# Patient Record
Sex: Female | Born: 1995 | Race: White | Hispanic: No | Smoking: Former smoker
Health system: Southern US, Community
[De-identification: ages and names within clinical notes are randomized; demographics above are authoritative.]

## PROBLEM LIST (undated history)

## (undated) ENCOUNTER — Inpatient Hospital Stay (HOSPITAL_COMMUNITY): Payer: Self-pay

## (undated) DIAGNOSIS — O2303 Infections of kidney in pregnancy, third trimester: Secondary | ICD-10-CM

## (undated) DIAGNOSIS — I10 Essential (primary) hypertension: Secondary | ICD-10-CM

## (undated) DIAGNOSIS — O139 Gestational [pregnancy-induced] hypertension without significant proteinuria, unspecified trimester: Secondary | ICD-10-CM

## (undated) DIAGNOSIS — D649 Anemia, unspecified: Secondary | ICD-10-CM

## (undated) DIAGNOSIS — N189 Chronic kidney disease, unspecified: Secondary | ICD-10-CM

## (undated) DIAGNOSIS — N390 Urinary tract infection, site not specified: Secondary | ICD-10-CM

## (undated) HISTORY — DX: Chronic kidney disease, unspecified: N18.9

## (undated) HISTORY — PX: NO PAST SURGERIES: SHX2092

## (undated) HISTORY — DX: Essential (primary) hypertension: I10

## (undated) HISTORY — DX: Anemia, unspecified: D64.9

## (undated) HISTORY — DX: Gestational (pregnancy-induced) hypertension without significant proteinuria, unspecified trimester: O13.9

---
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
## 2017-09-26 ENCOUNTER — Encounter (HOSPITAL_COMMUNITY): Payer: Self-pay | Admitting: Emergency Medicine

## 2017-09-26 ENCOUNTER — Other Ambulatory Visit: Payer: Self-pay

## 2017-09-26 ENCOUNTER — Emergency Department (HOSPITAL_COMMUNITY): Payer: Self-pay

## 2017-09-26 ENCOUNTER — Emergency Department (HOSPITAL_COMMUNITY)
Admission: EM | Admit: 2017-09-26 | Discharge: 2017-09-26 | Disposition: A | Discharge status: Home / Self Care | Payer: Self-pay | Attending: Emergency Medicine | Admitting: Emergency Medicine

## 2017-09-26 DIAGNOSIS — Z87891 Personal history of nicotine dependence: Secondary | ICD-10-CM | POA: Insufficient documentation

## 2017-09-26 DIAGNOSIS — Z79899 Other long term (current) drug therapy: Secondary | ICD-10-CM | POA: Insufficient documentation

## 2017-09-26 DIAGNOSIS — M25561 Pain in right knee: Secondary | ICD-10-CM | POA: Insufficient documentation

## 2017-09-26 DIAGNOSIS — Y939 Activity, unspecified: Secondary | ICD-10-CM | POA: Insufficient documentation

## 2017-09-26 DIAGNOSIS — Y998 Other external cause status: Secondary | ICD-10-CM | POA: Insufficient documentation

## 2017-09-26 DIAGNOSIS — R52 Pain, unspecified: Secondary | ICD-10-CM

## 2017-09-26 DIAGNOSIS — S8011XA Contusion of right lower leg, initial encounter: Secondary | ICD-10-CM | POA: Insufficient documentation

## 2017-09-26 DIAGNOSIS — R51 Headache: Secondary | ICD-10-CM | POA: Insufficient documentation

## 2017-09-26 DIAGNOSIS — S0001XA Abrasion of scalp, initial encounter: Secondary | ICD-10-CM

## 2017-09-26 DIAGNOSIS — M25552 Pain in left hip: Secondary | ICD-10-CM | POA: Insufficient documentation

## 2017-09-26 DIAGNOSIS — S0181XA Laceration without foreign body of other part of head, initial encounter: Secondary | ICD-10-CM | POA: Insufficient documentation

## 2017-09-26 DIAGNOSIS — Y92410 Unspecified street and highway as the place of occurrence of the external cause: Secondary | ICD-10-CM | POA: Insufficient documentation

## 2017-09-26 LAB — CBC WITH DIFFERENTIAL/PLATELET
Abs Immature Granulocytes: 0.1 10*3/uL (ref 0.0–0.1)
Basophils Absolute: 0.1 10*3/uL (ref 0.0–0.1)
Basophils Relative: 0 %
EOS ABS: 0 10*3/uL (ref 0.0–0.7)
Eosinophils Relative: 0 %
HEMATOCRIT: 38.8 % (ref 36.0–46.0)
Hemoglobin: 12.3 g/dL (ref 12.0–15.0)
IMMATURE GRANULOCYTES: 1 %
LYMPHS ABS: 1.5 10*3/uL (ref 0.7–4.0)
LYMPHS PCT: 7 %
MCH: 25.5 pg — ABNORMAL LOW (ref 26.0–34.0)
MCHC: 31.7 g/dL (ref 30.0–36.0)
MCV: 80.5 fL (ref 78.0–100.0)
Monocytes Absolute: 1.8 10*3/uL — ABNORMAL HIGH (ref 0.1–1.0)
Monocytes Relative: 9 %
NEUTROS PCT: 83 %
Neutro Abs: 16.7 10*3/uL — ABNORMAL HIGH (ref 1.7–7.7)
PLATELETS: 351 10*3/uL (ref 150–400)
RBC: 4.82 MIL/uL (ref 3.87–5.11)
RDW: 14.6 % (ref 11.5–15.5)
WBC: 20.2 10*3/uL — AB (ref 4.0–10.5)

## 2017-09-26 LAB — I-STAT BETA HCG BLOOD, ED (MC, WL, AP ONLY): I-stat hCG, quantitative: <5 m[IU]/mL (ref <5)

## 2017-09-26 LAB — BASIC METABOLIC PANEL
Anion gap: 9 (ref 5–15)
BUN: 13 mg/dL (ref 6–20)
CHLORIDE: 103 mmol/L (ref 101–111)
CO2: 24 mmol/L (ref 22–32)
CREATININE: 0.81 mg/dL (ref 0.44–1.00)
Calcium: 8.9 mg/dL (ref 8.9–10.3)
GFR calc non Af Amer: >60 mL/min (ref >60)
Glucose, Bld: 107 mg/dL — ABNORMAL HIGH (ref 65–99)
POTASSIUM: 3.9 mmol/L (ref 3.5–5.1)
SODIUM: 136 mmol/L (ref 135–145)

## 2017-09-26 LAB — PROTIME-INR
INR: 1.03
Prothrombin Time: 13.4 seconds (ref 11.4–15.2)

## 2017-09-26 MED ORDER — IBUPROFEN 600 MG PO TABS: 600.0000 mg | ORAL_TABLET | Freq: Four times a day (QID) | ORAL | 0 refills | Status: DC | PRN

## 2017-09-26 MED ORDER — FENTANYL CITRATE (PF) 100 MCG/2ML IJ SOLN
50.0000 ug | Freq: Once | INTRAMUSCULAR | Status: AC
Start: 2017-09-26 — End: 2017-09-26
  Administered 2017-09-26: 50 ug via INTRAVENOUS
  Filled 2017-09-26: qty 2

## 2017-09-26 MED ORDER — KETOROLAC TROMETHAMINE 15 MG/ML IJ SOLN
15.0000 mg | Freq: Once | INTRAMUSCULAR | Status: AC
  Administered 2017-09-26: 15 mg via INTRAVENOUS
  Filled 2017-09-26: qty 1

## 2017-09-26 MED ORDER — LIDOCAINE HCL (PF) 1 % IJ SOLN: 10.0000 mL | Freq: Once | INTRAMUSCULAR | Status: DC

## 2017-09-26 MED ORDER — FENTANYL CITRATE (PF) 100 MCG/2ML IJ SOLN
50.0000 ug | Freq: Once | INTRAMUSCULAR | Status: AC
  Administered 2017-09-26: 50 ug via INTRAVENOUS
  Filled 2017-09-26: qty 2

## 2017-09-26 MED ORDER — LIDOCAINE HCL (PF) 1 % IJ SOLN
INTRAMUSCULAR | Status: AC
  Filled 2017-09-26: qty 30

## 2017-09-26 MED ORDER — LIDOCAINE HCL (PF) 1 % IJ SOLN
10.0000 mL | Freq: Once | INTRAMUSCULAR | Status: DC
  Filled 2017-09-26: qty 10

## 2017-09-26 MED ORDER — SODIUM CHLORIDE 0.9 % IV BOLUS
1000.0000 mL | Freq: Once | INTRAVENOUS | Status: AC
  Administered 2017-09-26: 1000 mL via INTRAVENOUS

## 2017-09-26 NOTE — ED Notes (Signed)
Pt used female urinal. output
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
## 2017-10-06 ENCOUNTER — Other Ambulatory Visit: Payer: Self-pay

## 2017-10-06 ENCOUNTER — Emergency Department (HOSPITAL_COMMUNITY)
Admission: EM | Admit: 2017-10-06 | Discharge: 2017-10-06 | Disposition: A | Discharge status: Home / Self Care | Payer: Self-pay | Attending: Emergency Medicine | Admitting: Emergency Medicine

## 2017-10-06 ENCOUNTER — Encounter (HOSPITAL_COMMUNITY): Payer: Self-pay | Admitting: Emergency Medicine

## 2017-10-06 DIAGNOSIS — Z4802 Encounter for removal of sutures: Secondary | ICD-10-CM | POA: Insufficient documentation
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
## 2017-10-06 NOTE — ED Triage Notes (Signed)
Pt. Stated, Im here to get staples/sutures removed. Placed June 22. .Marland Kitchen

## 2017-10-06 NOTE — ED Notes (Signed)
Staple and suture removal kits at bedside.

## 2017-10-14 ENCOUNTER — Encounter (INDEPENDENT_AMBULATORY_CARE_PROVIDER_SITE_OTHER): Payer: Self-pay | Admitting: Orthopaedic Surgery

## 2017-10-14 ENCOUNTER — Ambulatory Visit (INDEPENDENT_AMBULATORY_CARE_PROVIDER_SITE_OTHER): Payer: Self-pay | Admitting: Orthopaedic Surgery

## 2017-10-14 DIAGNOSIS — S8011XA Contusion of right lower leg, initial encounter: Secondary | ICD-10-CM
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
## 2018-04-07 NOTE — L&D Delivery Note (Signed)
Delivery Note  Gabriella Peters 23 y.o. [id number] at [redacted]w[redacted]d admitted to L&D 9/22 for IOL 2/2 GHTN. On admit she was 3/50/ballotable, started pitocin at 1:30pm, progressed to 4/50/-2 and was AROM'ed at 6pm. She continued to progress well, At 9pm 6.5/90/-1 and completed at 10:18pm. Pushing then ensued, with excellent maternal effort, at 10:36 PM a viable female was delivered via Vaginal, Spontaneous.  Presentation: vertex; Position: Left,OA; Station: +5. Following delivery of the head, which restituted maternal right. The body then did not deliver spontaneously, shoulder deliver called, mcroberts and then suprapubic pressure were performed followed by posterior arm and the left arm was delivered with 45 seconds. Cord was clamped and cut and handed off to the nurses. The placenta then delivered spontaneously and intact. Uterus firm. 1st degree hemostatic without repair and bilateral periurethral tears hemostatic without repair. EBL 100cc. Mom and husband updated regarding shoulder dystocia, all questions answered. Baby moving all extremitities. Baby skin to skin with mom.   Delivery of the head: 12/28/2018 10:36 PM First maneuver: 12/28/2018 10:36 PM, McRoberts Second maneuver: 12/28/2018 10:36 PM, Suprapubic Pressure Third maneuver: 12/28/2018 10:36 PM,  Left posterior arm delivered  Verbal consent: obtained from patient.  APGAR: 6, 9; weight 3330g .   Placenta status: intact Cord:  Venous Cord pH: 7.325  Anesthesia:   Episiotomy: None Lacerations: 1st degree;Periurethral Suture Repair: hemostatic without repair Est. Blood Loss (mL): 100  Mom to postpartum.  Baby to Couplet care / Skin to Skin.  Gabriella Peters 12/28/2018, 10:49 PM

## 2018-05-25 LAB — OB RESULTS CONSOLE RUBELLA ANTIBODY, IGM: Rubella: IMMUNE

## 2018-05-25 LAB — OB RESULTS CONSOLE GC/CHLAMYDIA
Chlamydia: NEGATIVE
Gonorrhea: NEGATIVE

## 2018-05-25 LAB — OB RESULTS CONSOLE HGB/HCT, BLOOD
HCT: 42 — AB (ref 29–41)
Hemoglobin: 12.9

## 2018-05-25 LAB — OB RESULTS CONSOLE RPR: RPR: NONREACTIVE

## 2018-05-25 LAB — OB RESULTS CONSOLE HIV ANTIBODY (ROUTINE TESTING): HIV: NONREACTIVE

## 2018-05-25 LAB — OB RESULTS CONSOLE PLATELET COUNT: Platelets: 332

## 2018-05-25 LAB — OB RESULTS CONSOLE HEPATITIS B SURFACE ANTIGEN: Hepatitis B Surface Ag: NEGATIVE

## 2018-05-25 LAB — CYSTIC FIBROSIS DIAGNOSTIC STUDY: Interpretation-CFDNA:: NEGATIVE

## 2018-08-29 IMAGING — US US ABDOMEN LIMITED
1 series · 14 of 25 positions shown · non-contrast
Comparison: None.

CLINICAL DATA: Right upper quadrant pain for 1 week

EXAM:
US ABDOMEN LIMITED - RIGHT UPPER QUADRANT

[Series 1: us abdomen limited · 0.20mm/px · 14 of 51 slices shown]
[im 1/51]
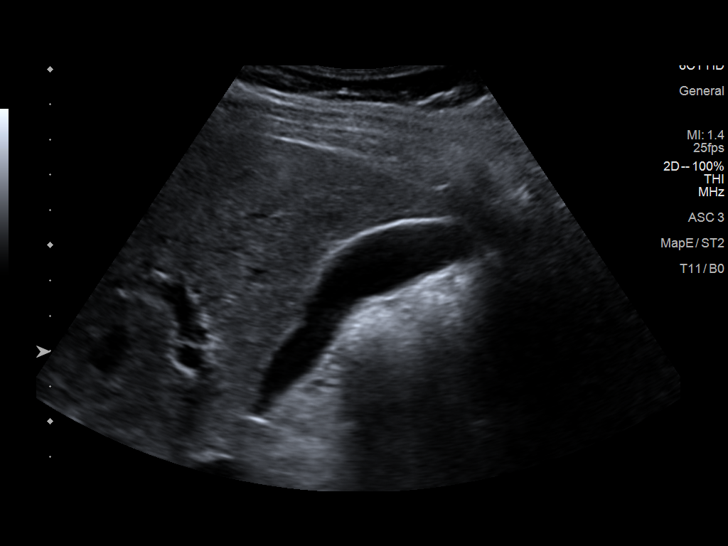
[im 5/51]
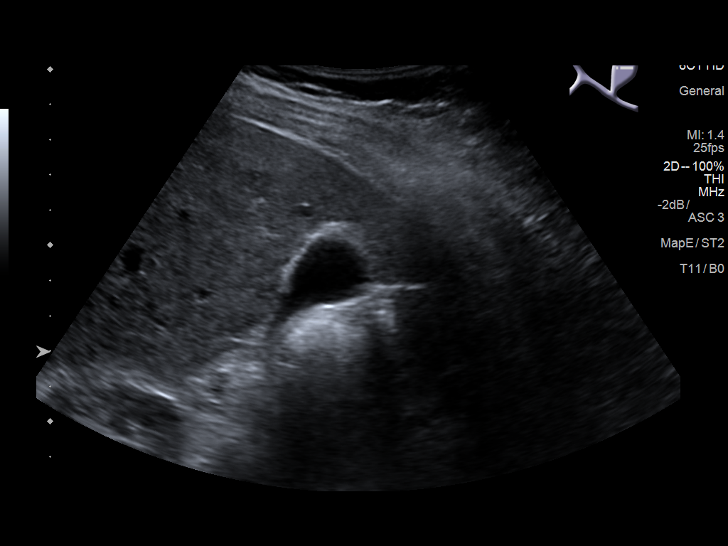
[im 9/51]
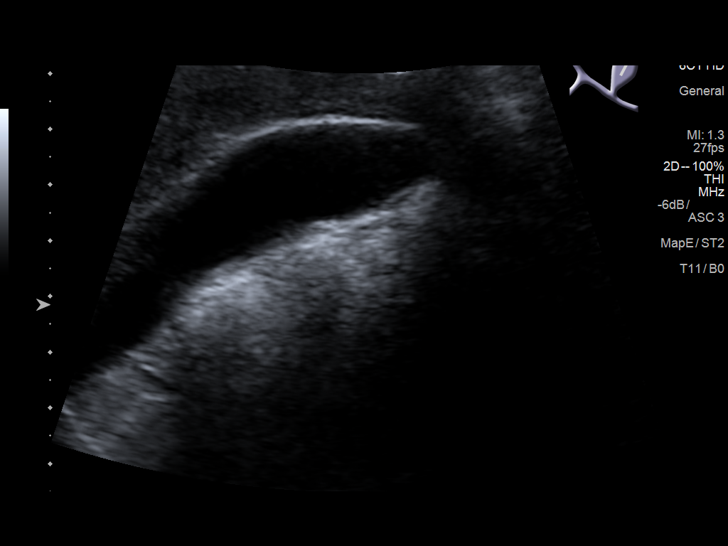
[im 13/51]
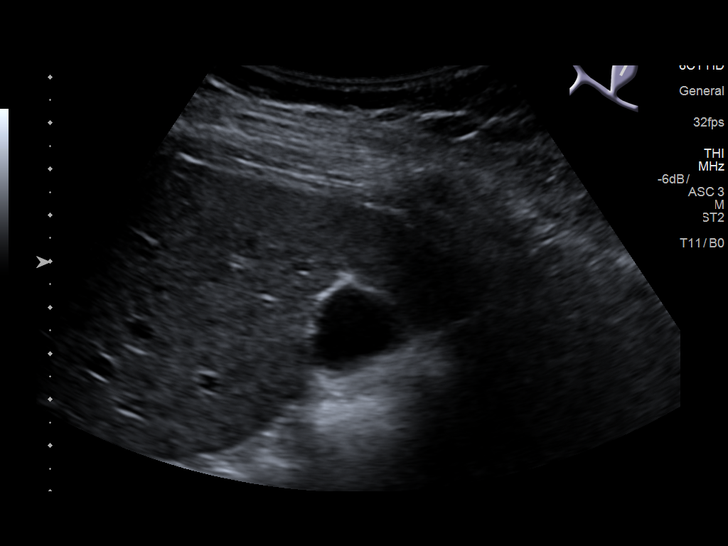
[im 17/51]
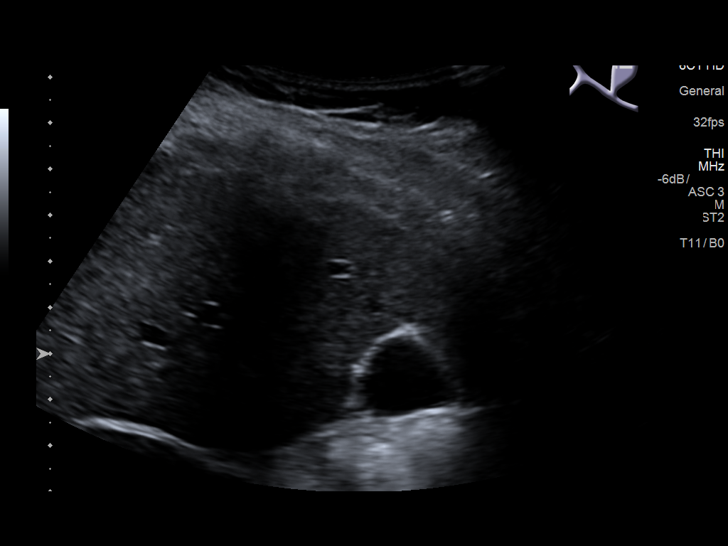
[im 19/51]
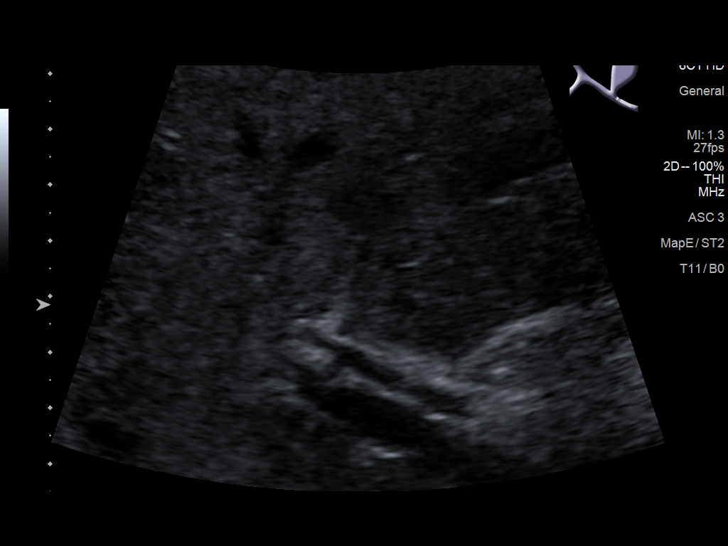
[im 23/51]
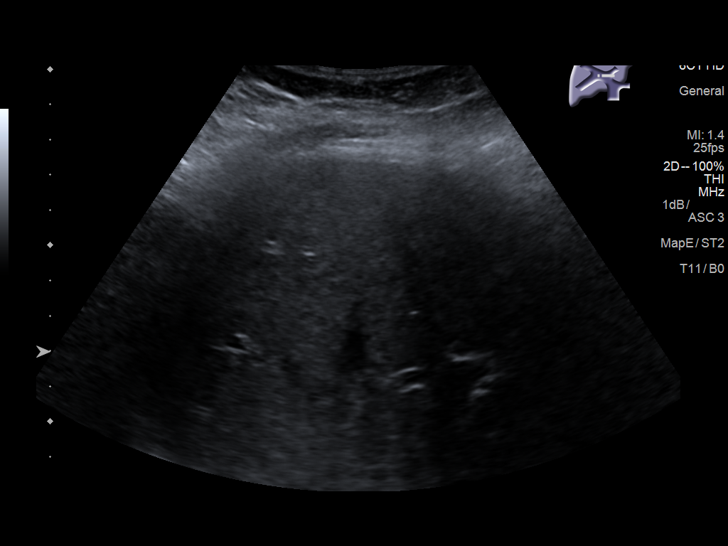
[im 28/51]
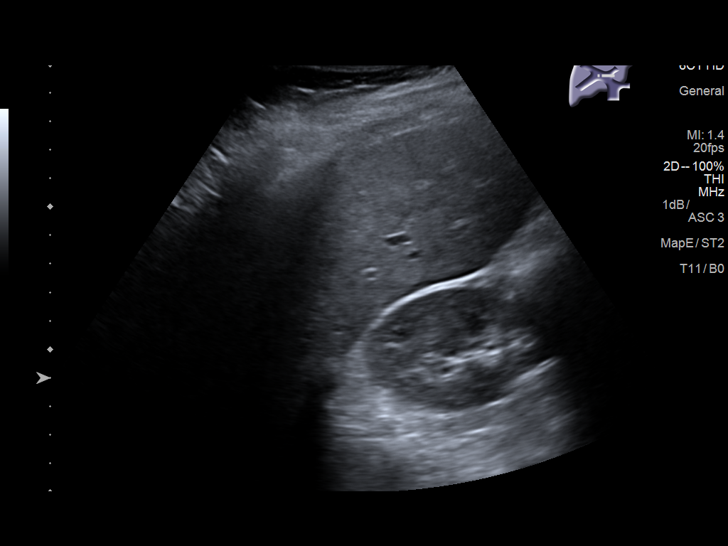
[im 32/51]
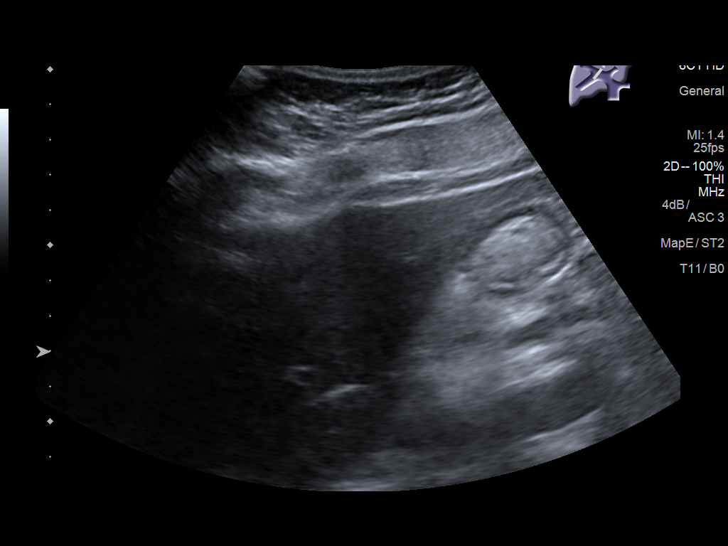
[im 34/51]
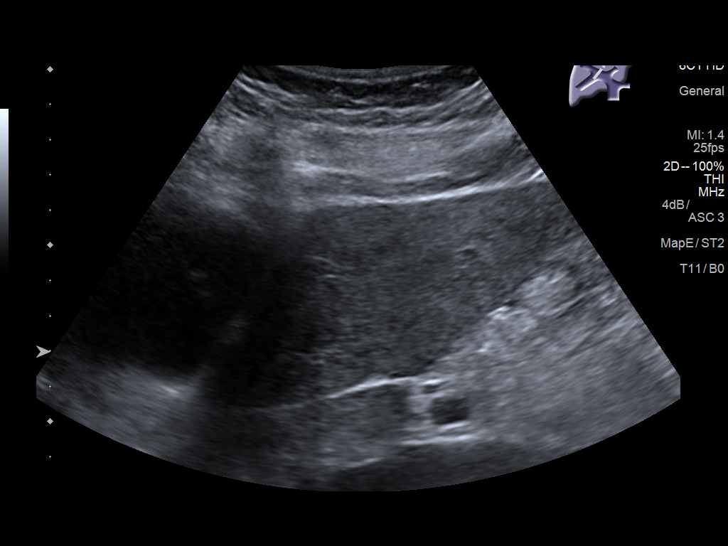
[im 38/51]
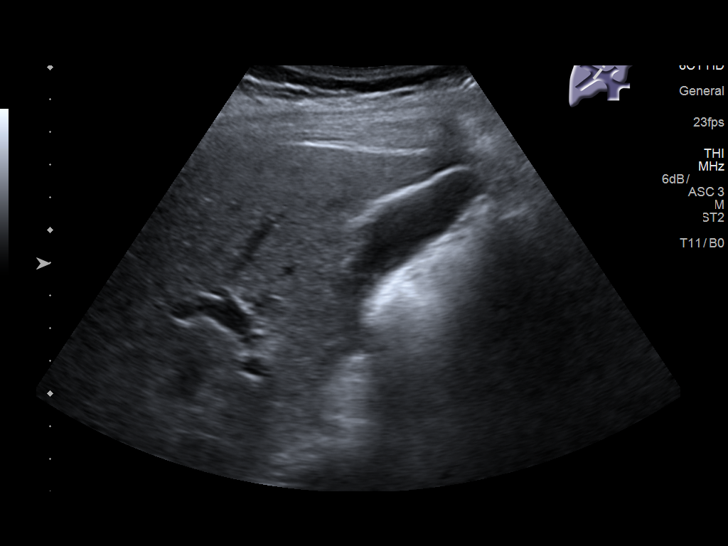
[im 42/51]
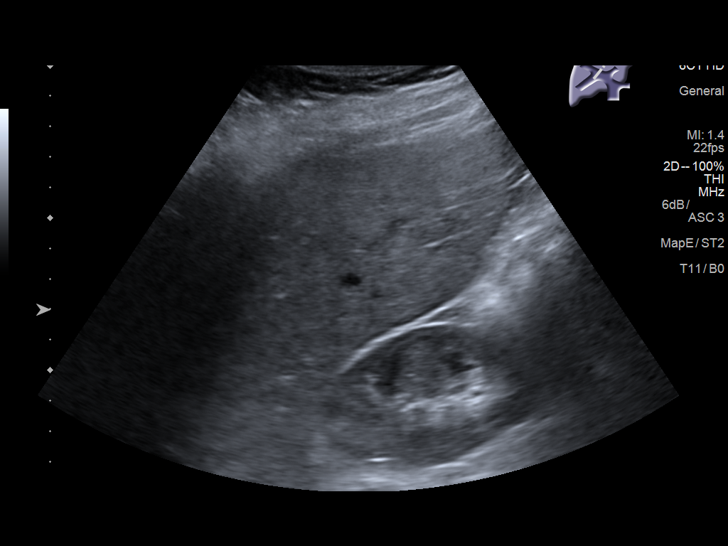
[im 46/51]
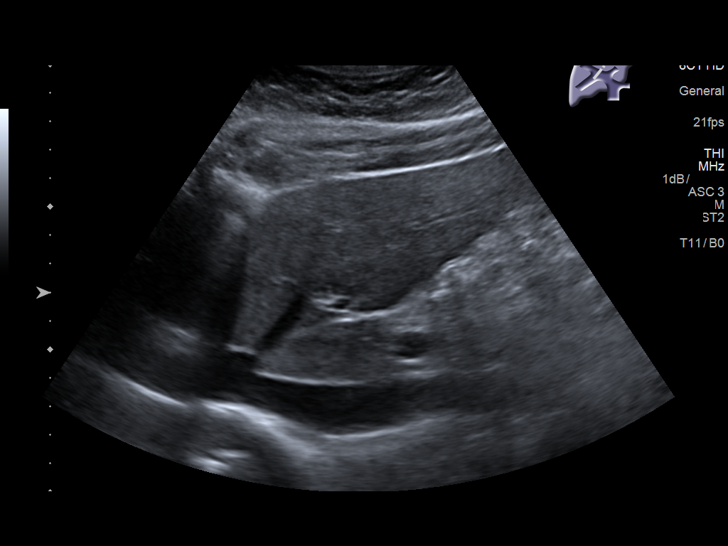
[im 51/51]
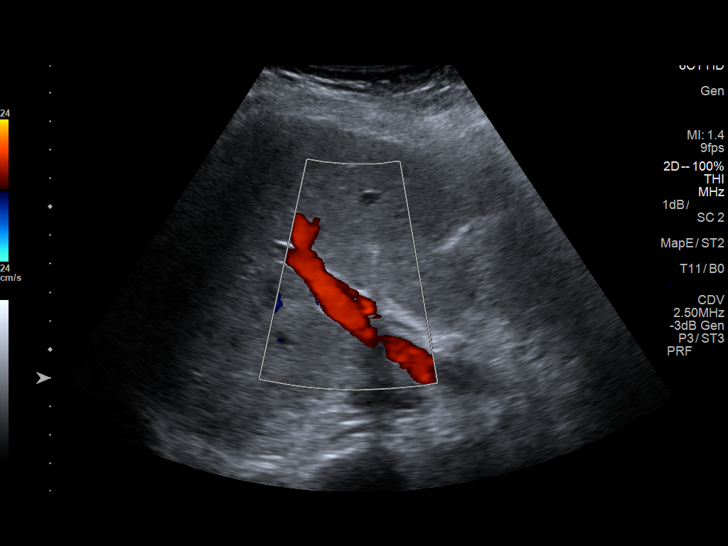

[14 of 25 positions shown; findings below may reference images not displayed]

FINDINGS: Gallbladder:

No gallstones or wall thickening visualized. No sonographic Murphy
sign noted by sonographer.

Common bile duct:

Diameter: Normal at 4 mm

Liver:

No focal lesion identified. Within normal limits in parenchymal
echogenicity.
IMPRESSION: Negative right upper quadrant abdominal ultrasound

## 2018-11-16 LAB — OB RESULTS CONSOLE RPR: RPR: NONREACTIVE

## 2018-11-16 LAB — HIV ANTIBODY (ROUTINE TESTING W REFLEX): HIV Screen 4th Generation wRfx: NONREACTIVE

## 2018-11-16 LAB — OB RESULTS CONSOLE HEPATITIS B SURFACE ANTIGEN: Hepatitis B Surface Ag: NEGATIVE

## 2018-11-27 ENCOUNTER — Inpatient Hospital Stay (HOSPITAL_COMMUNITY)
Admission: AD | Admit: 2018-11-27 | Discharge: 2018-11-28 | Disposition: A | Discharge status: Home / Self Care | Payer: BLUE CROSS/BLUE SHIELD | Attending: Obstetrics and Gynecology | Admitting: Obstetrics and Gynecology

## 2018-11-27 ENCOUNTER — Other Ambulatory Visit: Payer: Self-pay

## 2018-11-27 ENCOUNTER — Encounter (HOSPITAL_COMMUNITY): Payer: Self-pay

## 2018-11-27 DIAGNOSIS — O4703 False labor before 37 completed weeks of gestation, third trimester: Secondary | ICD-10-CM | POA: Insufficient documentation

## 2018-11-27 DIAGNOSIS — R030 Elevated blood-pressure reading, without diagnosis of hypertension: Secondary | ICD-10-CM | POA: Insufficient documentation

## 2018-11-27 DIAGNOSIS — O212 Late vomiting of pregnancy: Secondary | ICD-10-CM | POA: Insufficient documentation

## 2018-11-27 DIAGNOSIS — O26893 Other specified pregnancy related conditions, third trimester: Secondary | ICD-10-CM | POA: Diagnosis not present

## 2018-11-27 DIAGNOSIS — Z791 Long term (current) use of non-steroidal anti-inflammatories (NSAID): Secondary | ICD-10-CM | POA: Diagnosis not present

## 2018-11-27 DIAGNOSIS — A5900 Urogenital trichomoniasis, unspecified: Secondary | ICD-10-CM

## 2018-11-27 DIAGNOSIS — Z87891 Personal history of nicotine dependence: Secondary | ICD-10-CM | POA: Diagnosis not present

## 2018-11-27 DIAGNOSIS — Z79899 Other long term (current) drug therapy: Secondary | ICD-10-CM | POA: Diagnosis not present

## 2018-11-27 DIAGNOSIS — Z3A34 34 weeks gestation of pregnancy: Secondary | ICD-10-CM | POA: Insufficient documentation

## 2018-11-27 DIAGNOSIS — Z3689 Encounter for other specified antenatal screening: Secondary | ICD-10-CM

## 2018-11-27 LAB — URINALYSIS, ROUTINE W REFLEX MICROSCOPIC
Bilirubin Urine: NEGATIVE
Glucose, UA: NEGATIVE mg/dL
Hgb urine dipstick: NEGATIVE
Ketones, ur: NEGATIVE mg/dL
Nitrite: NEGATIVE
Protein, ur: NEGATIVE mg/dL
Specific Gravity, Urine: 1.009 (ref 1.005–1.030)
pH: 6 (ref 5.0–8.0)

## 2018-11-27 LAB — WET PREP, GENITAL
Sperm: NONE SEEN
Yeast Wet Prep HPF POC: NONE SEEN

## 2018-11-27 MED ORDER — METRONIDAZOLE 500 MG PO TABS
2000.0000 mg | ORAL_TABLET | Freq: Once | ORAL | Status: AC
  Administered 2018-11-27: 2000 mg via ORAL
  Filled 2018-11-27: qty 4

## 2018-11-27 MED ORDER — LACTATED RINGERS IV BOLUS: 1000.0000 mL | Freq: Once | INTRAVENOUS | Status: DC

## 2018-11-27 MED ORDER — ONDANSETRON 4 MG PO TBDP
4.0000 mg | ORAL_TABLET | Freq: Once | ORAL | Status: AC
  Administered 2018-11-27: 4 mg via ORAL
  Filled 2018-11-27: qty 1

## 2018-11-27 MED ORDER — ONDANSETRON HCL 4 MG/2ML IJ SOLN: 4.0000 mg | Freq: Once | INTRAMUSCULAR | Status: DC

## 2018-11-27 NOTE — MAU Note (Signed)
Pt reporting contractions all day but worsened around 5pm, now every 7 mins. Thinks contractions are from dehydration because she has vomited 3 times today. Thinks she may have food poisoning as her father also got sick after they ate at [employer] last night. Denies LOF or vaginal bleeding. Reports good fetal movement. Cervix 1cm on last exam.

## 2018-11-27 NOTE — MAU Provider Note (Signed)
History     CSN: [id number]  Arrival date and time: 11/27/18 2131   First Provider Initiated Contact with Patient 11/27/18 2253      Chief Complaint  Patient presents with  . Contractions  . Emesis    Gabriella Peters is a 23 y.o. [phone] at 4849w2d who receives care at [facility].  She presents today for Contractions and Emesis.  She states she has been having vomiting around 0930 and she has had . She states she started having contractions around 1030 that was mild, but then started to pick up in intensity around 5pm.  She endorses fetal movement and reports some mucous-type spotting yesterday, but denies leaking or discharge.  She reports she has had one incident of vomiting since arrival and the contractions are manageable, but "definitely hurt."  Patient reports she had her last baby at 35 weeks. She states she is not on any weekly progesterone injections or has not had any steroid injections, but was scheduled for Monday. She states she was 1cm on August 11th, stating she was checked for pelvic pain and spotting. Patient endorses having some elevated bp in office, but has not been told she has high blood pressure.       OB History    Gravida  4   Para  1   Term      Preterm  1   AB  2   Living  1     SAB  2   TAB      Ectopic      Multiple      Live Births  1           Past Medical History:  Diagnosis Date  . Pyelonephritis complicating pregnancy in third trimester   . Urinary tract infection     Past Surgical History:  Procedure Laterality Date  . NO PAST SURGERIES      Family History  Problem Relation Age of Onset  . Hypertension Mother   . Heart disease Mother   . Diabetes Father   . Heart disease Maternal Grandfather     Social History   Tobacco Use  . Smoking status: Former Smoker    Types: Cigarettes    Quit date: 08/05/2012    Years since quitting: 6.3  . Smokeless tobacco: Never Used  . Tobacco comment: May 2014 when  found out preg  Substance Use Topics  . Alcohol use: No  . Drug use: No    Allergies:  Allergies  Allergen Reactions  . Pineapple     Medications Prior to Admission  Medication Sig Dispense Refill Last Dose  . acetaminophen (TYLENOL) 500 MG tablet Take 1,000 mg by mouth every 6 (six) hours as needed.   Past Week at Unknown time  . cephALEXin (KEFLEX) 500 MG capsule Take 1 capsule (500 mg total) by mouth 4 (four) times daily. 40 capsule 0   . cyclobenzaprine (FLEXERIL) 10 MG tablet Take 1 tablet (10 mg total) by mouth 2 (two) times daily as needed for muscle spasms. 10 tablet 0   . diazepam (VALIUM) 5 MG tablet Take 1 tablet (5 mg total) by mouth every 8 (eight) hours as needed for muscle spasms. 20 tablet 0   . diphenhydrAMINE (BENADRYL) 25 MG tablet Take 25 mg by mouth daily as needed for sleep.     Marland Kitchen. HYDROcodone-acetaminophen (NORCO/VICODIN) 5-325 MG tablet Take 1 tablet by mouth every 4 (four) hours as needed. 10 tablet 0   .  ibuprofen (ADVIL,MOTRIN) 600 MG tablet Take 1 tablet (600 mg total) by mouth every 6 (six) hours as needed. 30 tablet 0   . meloxicam (MOBIC) 15 MG tablet Take 1 tablet (15 mg total) by mouth daily. 30 tablet 0   . methocarbamol (ROBAXIN) 500 MG tablet Take 1 tablet (500 mg total) by mouth 3 (three) times daily as needed for muscle spasms. 20 tablet 0   . methylPREDNISolone (MEDROL DOSEPAK) 4 MG TBPK tablet Use as directed 21 tablet 0   . ondansetron (ZOFRAN ODT) 4 MG disintegrating tablet Take 1 tablet (4 mg total) by mouth every 8 (eight) hours as needed for nausea. 10 tablet 0   . oxyCODONE-acetaminophen (PERCOCET/ROXICET) 5-325 MG tablet Take 1 tablet by mouth every 4 (four) hours as needed for severe pain.     . phenazopyridine (PYRIDIUM) 200 MG tablet Take 1 tablet (200 mg total) by mouth 3 (three) times daily as needed for pain. 10 tablet 0     Review of Systems  Constitutional: Negative for chills and fever.  Respiratory: Negative for cough and  shortness of breath.   Gastrointestinal: Positive for abdominal pain (Tender behind my belly button.), nausea and vomiting. Negative for constipation and diarrhea.  Genitourinary: Negative for difficulty urinating, dysuria, vaginal bleeding and vaginal discharge.  Neurological: Negative for dizziness, light-headedness and headaches.   Physical Exam   Blood pressure (!) 141/92, pulse 95, temperature 98.3 F (36.8 C), resp. rate 20, height 5\' 3"  (1.6 m), weight 96 kg, SpO2 95 %, unknown if currently breastfeeding. Vitals:   11/27/18 2315 11/27/18 2330 11/27/18 2345 11/28/18 0000  BP: 128/74 125/76 (!) 139/94 133/90  Pulse: 100 98 95 96  Resp:      Temp:      SpO2:      Weight:      Height:        Physical Exam  Constitutional: She is oriented to person, place, and time. She appears well-developed and well-nourished. No distress.  HENT:  Head: Normocephalic and atraumatic.  Eyes: Conjunctivae are normal.  Neck: Normal range of motion.  Cardiovascular: Normal rate, regular rhythm and normal heart sounds.  Respiratory: Effort normal and breath sounds normal.  GI: Soft. There is no abdominal tenderness.  Genitourinary: Cervix exhibits discharge. Cervix exhibits no motion tenderness and no friability.    Vaginal discharge present.     Genitourinary Comments: Speculum Exam: -Normal External Genitalia: Non tender, Small amt discharge at introitus.  -Vaginal Vault: Pink mucosa with good rugae. Moderate amt frothy whitish-pink discharge -wet prep collected -Cervix:Pink, no lesions, cysts, or polyps.  Appears closed. No active bleeding, but frothy discharge present from os. -Bimanual Exam:   1 external, but closed internally/Thick/ Ballotable.   Musculoskeletal: Normal range of motion.  Neurological: She is alert and oriented to person, place, and time.  Skin: Skin is warm and dry.  Psychiatric: She has a normal mood and affect. Her behavior is normal.    Fetal Assessment 155 bpm, Mod  Var, -Decels, +Accels Toco: Q1-724min  MAU Course   Results for orders placed or performed during the hospital encounter of 11/27/18 (from the past 24 hour(s))  Urinalysis, Routine w reflex microscopic     Status: Abnormal   Collection Time: 11/27/18 10:38 PM  Result Value Ref Range   Color, Urine YELLOW YELLOW   APPearance CLEAR CLEAR   Specific Gravity, Urine 1.009 1.005 - 1.030   pH 6.0 5.0 - 8.0   Glucose, UA NEGATIVE NEGATIVE mg/dL  Hgb urine dipstick NEGATIVE NEGATIVE   Bilirubin Urine NEGATIVE NEGATIVE   Ketones, ur NEGATIVE NEGATIVE mg/dL   Protein, ur NEGATIVE NEGATIVE mg/dL   Nitrite NEGATIVE NEGATIVE   Leukocytes,Ua LARGE (A) NEGATIVE   RBC / HPF 6-10 0 - 5 RBC/hpf   WBC, UA 11-20 0 - 5 WBC/hpf   Bacteria, UA RARE (A) NONE SEEN   Squamous Epithelial / LPF 0-5 0 - 5   Mucus PRESENT    Trichomonas, UA PRESENT (A) NONE SEEN  Protein / creatinine ratio, urine     Status: None   Collection Time: 11/27/18 10:38 PM  Result Value Ref Range   Creatinine, Urine 82.17 mg/dL   Total Protein, Urine 7 mg/dL   Protein Creatinine Ratio 0.09 0.00 - 0.15 mg/mg[Cre]  Wet prep, genital     Status: Abnormal   Collection Time: 11/27/18 11:08 PM  Result Value Ref Range   Yeast Wet Prep HPF POC NONE SEEN NONE SEEN   Trich, Wet Prep PRESENT (A) NONE SEEN   Clue Cells Wet Prep HPF POC PRESENT (A) NONE SEEN   WBC, Wet Prep HPF POC MANY (A) NONE SEEN   Sperm NONE SEEN   CBC     Status: Abnormal   Collection Time: 11/27/18 11:27 PM  Result Value Ref Range   WBC 10.8 (H) 4.0 - 10.5 K/uL   RBC 4.25 3.87 - 5.11 MIL/uL   Hemoglobin 10.8 (L) 12.0 - 15.0 g/dL   HCT 16.134.7 (L) 09.636.0 - 04.546.0 %   MCV 81.6 80.0 - 100.0 fL   MCH 25.4 (L) 26.0 - 34.0 pg   MCHC 31.1 30.0 - 36.0 g/dL   RDW 40.913.3 81.111.5 - 91.415.5 %   Platelets 370 150 - 400 K/uL   nRBC 0.0 0.0 - 0.2 %  Comprehensive metabolic panel     Status: Abnormal   Collection Time: 11/27/18 11:27 PM  Result Value Ref Range   Sodium 137 135 - 145  mmol/L   Potassium 3.7 3.5 - 5.1 mmol/L   Chloride 106 98 - 111 mmol/L   CO2 21 (L) 22 - 32 mmol/L   Glucose, Bld 85 70 - 99 mg/dL   BUN <5 (L) 6 - 20 mg/dL   Creatinine, Ser 7.820.66 0.44 - 1.00 mg/dL   Calcium 9.0 8.9 - 95.610.3 mg/dL   Total Protein 6.7 6.5 - 8.1 g/dL   Albumin 2.8 (L) 3.5 - 5.0 g/dL   AST 18 15 - 41 U/L   ALT 19 0 - 44 U/L   Alkaline Phosphatase 165 (H) 38 - 126 U/L   Total Bilirubin 0.6 0.3 - 1.2 mg/dL   GFR calc non Af Amer >60 >60 mL/min   GFR calc Af Amer >60 >60 mL/min   Anion gap 10 5 - 15   No results found.  MDM PE Labs: UA, Wet Prep, CBC, CMP, PC Ratio EFM Antiemetic Assessment and Plan  23 year old [id number]  SIUP at 34.3weeks Cat I FT Contractions Elevated BP Nausea  -Exam findings discussed. -Informed that cervix is closed internally and due to that will not offer steroid injection tonight.  -Wet prep collected and sent. -Will start IV and give Zofran for nausea. -Will collect PreEclampsia labs. -Patient without questions or concerns. -Will reassess -NST reactive  Cherre RobinsJessica L Matha Masse MSN, CNM 11/27/2018, 10:53 PM   Reassessment (11:46 PM) Trichomoniasis  -Urine specimen returns significant for trichomoniasis. -In room to discuss results with patient. -Informed that this is likely causing uterine contractions  and UA not reflective of dehydration. -Patient without questions or concerns, but does desire partner treatment. -Rx for EPT given for Brice Mebane DOB 05/02/1994 for Flagyl  -Patient to be given Zofran 4mg  ODT prior to flagyl dosing. -Flagyl 2g ordered. -Will add on GC/CT to urine specimen.   Reassessment (12:24 AM)  -PC Ratio returns without significant findings. -Patient to follow up in office as scheduled. -Encouraged to call or return to MAU if symptoms worsen or with the onset of new symptoms. -Discharged to home in stable condition.  Maryann Conners MSN, CNM 11/28/2018

## 2018-11-28 DIAGNOSIS — O4703 False labor before 37 completed weeks of gestation, third trimester: Secondary | ICD-10-CM | POA: Diagnosis not present

## 2018-11-28 LAB — COMPREHENSIVE METABOLIC PANEL
ALT: 19 U/L (ref 0–44)
AST: 18 U/L (ref 15–41)
Albumin: 2.8 g/dL — ABNORMAL LOW (ref 3.5–5.0)
Alkaline Phosphatase: 165 U/L — ABNORMAL HIGH (ref 38–126)
Anion gap: 10 (ref 5–15)
BUN: <5 mg/dL — ABNORMAL LOW (ref 6–20)
CO2: 21 mmol/L — ABNORMAL LOW (ref 22–32)
Calcium: 9 mg/dL (ref 8.9–10.3)
Chloride: 106 mmol/L (ref 98–111)
Creatinine, Ser: 0.66 mg/dL (ref 0.44–1.00)
GFR calc Af Amer: >60 mL/min (ref >60)
GFR calc non Af Amer: >60 mL/min (ref >60)
Glucose, Bld: 85 mg/dL (ref 70–99)
Potassium: 3.7 mmol/L (ref 3.5–5.1)
Sodium: 137 mmol/L (ref 135–145)
Total Bilirubin: 0.6 mg/dL (ref 0.3–1.2)
Total Protein: 6.7 g/dL (ref 6.5–8.1)

## 2018-11-28 LAB — PROTEIN / CREATININE RATIO, URINE
Creatinine, Urine: 82.17 mg/dL
Protein Creatinine Ratio: 0.09 mg/mg{Cre} (ref 0.00–0.15)
Total Protein, Urine: 7 mg/dL

## 2018-11-28 LAB — CBC
HCT: 34.7 % — ABNORMAL LOW (ref 36.0–46.0)
Hemoglobin: 10.8 g/dL — ABNORMAL LOW (ref 12.0–15.0)
MCH: 25.4 pg — ABNORMAL LOW (ref 26.0–34.0)
MCHC: 31.1 g/dL (ref 30.0–36.0)
MCV: 81.6 fL (ref 80.0–100.0)
Platelets: 370 10*3/uL (ref 150–400)
RBC: 4.25 MIL/uL (ref 3.87–5.11)
RDW: 13.3 % (ref 11.5–15.5)
WBC: 10.8 10*3/uL — ABNORMAL HIGH (ref 4.0–10.5)
nRBC: 0 % (ref 0.0–0.2)

## 2018-11-28 NOTE — Discharge Instructions (Signed)
Trichomoniasis Trichomoniasis is an STI (sexually transmitted infection) that can affect both women and men. In women, the outer area of the female genitalia (vulva) and the vagina are affected. In men, mainly the penis is affected, but the prostate and other reproductive organs can also be involved.  This condition can be treated with medicine. It often has no symptoms (is asymptomatic), especially in men. If not treated, trichomoniasis can last for months or years. What are the causes? This condition is caused by a parasite called Trichomonas vaginalis. Trichomoniasis most often spreads from person to person (is contagious) through sexual contact. What increases the risk? The following factors may make you more likely to develop this condition:  Having unprotected sex.  Having sex with a partner who has trichomoniasis.  Having multiple sexual partners.  Having had previous trichomoniasis infections or other STIs. What are the signs or symptoms? In women, symptoms of trichomoniasis include:  Abnormal vaginal discharge that is clear, white, gray, or yellow-green and foamy and has an unusual "fishy" odor.  Itching and irritation of the vagina and vulva.  Burning or pain during urination or sex.  Redness and swelling of the genitals. In men, symptoms of trichomoniasis include:  Penile discharge that may be foamy or contain pus.  Pain in the penis. This may happen only when urinating.  Itching or irritation inside the penis.  Burning after urination or ejaculation. How is this diagnosed? In women, this condition may be found during a routine Pap test or physical exam. It may be found in men during a routine physical exam. Your health care provider may do tests to help diagnose this infection, such as:  Urine tests (men and women).  The following in women: ? Testing the pH of the vagina. ? A vaginal swab test that checks for the Trichomonas vaginalis parasite. ? Testing vaginal  secretions. Your health care provider may test you for other STIs, including HIV (human immunodeficiency virus). How is this treated? This condition is treated with medicine taken by mouth (orally), such as metronidazole or tinidazole, to fight the infection. Your sexual partner(s) also need to be tested and treated.  If you are a woman and you plan to become pregnant or think you may be pregnant, tell your health care provider right away. Some medicines that are used to treat the infection should not be taken during pregnancy. Your health care provider may recommend over-the-counter medicines or creams to help relieve itching or irritation. You may be tested for infection again 3 months after treatment. Follow these instructions at home:  Take and use over-the-counter and prescription medicines, including creams, only as told by your health care provider.  Take your antibiotic medicine as told by your health care provider. Do not stop taking the antibiotic even if you start to feel better.  Do not have sex until 7-10 days after you finish your medicine, or until your health care provider approves. Ask your health care provider when you may start to have sex again.  (Women) Do not douche or wear tampons while you have the infection.  Discuss your infection with your sexual partner(s). Make sure that your partner gets tested and treated, if necessary.  Keep all follow-up visits as told by your health care provider. This is important. How is this prevented?   Use condoms every time you have sex. Using condoms correctly and consistently can help protect against STIs.  Avoid having multiple sexual partners.  Talk with your sexual partner about any   symptoms that either of you may have, as well as any history of STIs.  Get tested for STIs and STDs (sexually transmitted diseases) before you have sex. Ask your partner to do the same.  Do not have sexual contact if you have symptoms of  trichomoniasis or another STI. Contact a health care provider if:  You still have symptoms after you finish your medicine.  You develop pain in your abdomen.  You have pain when you urinate.  You have bleeding after sex.  You develop a rash.  You feel nauseous or you vomit.  You plan to become pregnant or think you may be pregnant. Summary  Trichomoniasis is an STI (sexually transmitted infection) that can affect both women and men.  This condition often has no symptoms (is asymptomatic), especially in men.  Without treatment, this condition can last for months or years.  You should not have sex until 7-10 days after you finish your medicine, or until your health care provider approves. Ask your health care provider when you may start to have sex again.  Discuss your infection with your sexual partner(s). Make sure that your partner gets tested and treated, if necessary. This information is not intended to replace advice given to you by your health care provider. Make sure you discuss any questions you have with your health care provider. Document Released: 09/17/2000 Document Revised: 01/05/2018 Document Reviewed: 01/05/2018 Elsevier Patient Education  2020 [employer]  

## 2018-11-28 NOTE — MAU Note (Signed)
From 2133-2156, FHT is a tracing of another patient.

## 2018-12-06 LAB — OB RESULTS CONSOLE GBS: GBS: POSITIVE

## 2018-12-06 LAB — OB RESULTS CONSOLE GC/CHLAMYDIA
Chlamydia: NEGATIVE
Gonorrhea: NEGATIVE

## 2018-12-28 ENCOUNTER — Inpatient Hospital Stay (HOSPITAL_COMMUNITY): Payer: Medicaid Other | Admitting: Anesthesiology

## 2018-12-28 ENCOUNTER — Other Ambulatory Visit: Payer: Self-pay

## 2018-12-28 ENCOUNTER — Encounter (HOSPITAL_COMMUNITY): Payer: Self-pay

## 2018-12-28 ENCOUNTER — Inpatient Hospital Stay (HOSPITAL_COMMUNITY)
Admission: AD | Admit: 2018-12-28 | Discharge: 2018-12-30 | DRG: 807 | Disposition: A | Discharge status: Home / Self Care | Payer: Medicaid Other | Attending: Obstetrics & Gynecology | Admitting: Obstetrics & Gynecology

## 2018-12-28 DIAGNOSIS — O99824 Streptococcus B carrier state complicating childbirth: Secondary | ICD-10-CM | POA: Diagnosis present

## 2018-12-28 DIAGNOSIS — Z87891 Personal history of nicotine dependence: Secondary | ICD-10-CM

## 2018-12-28 DIAGNOSIS — Z6791 Unspecified blood type, Rh negative: Secondary | ICD-10-CM

## 2018-12-28 DIAGNOSIS — Z3A38 38 weeks gestation of pregnancy: Secondary | ICD-10-CM

## 2018-12-28 DIAGNOSIS — Z8759 Personal history of other complications of pregnancy, childbirth and the puerperium: Secondary | ICD-10-CM | POA: Diagnosis present

## 2018-12-28 DIAGNOSIS — Z20828 Contact with and (suspected) exposure to other viral communicable diseases: Secondary | ICD-10-CM | POA: Diagnosis present

## 2018-12-28 DIAGNOSIS — O134 Gestational [pregnancy-induced] hypertension without significant proteinuria, complicating childbirth: Secondary | ICD-10-CM | POA: Diagnosis present

## 2018-12-28 DIAGNOSIS — O26893 Other specified pregnancy related conditions, third trimester: Secondary | ICD-10-CM | POA: Diagnosis present

## 2018-12-28 DIAGNOSIS — O139 Gestational [pregnancy-induced] hypertension without significant proteinuria, unspecified trimester: Secondary | ICD-10-CM | POA: Diagnosis present

## 2018-12-28 LAB — COMPREHENSIVE METABOLIC PANEL
ALT: 13 U/L (ref 0–44)
AST: 16 U/L (ref 15–41)
Albumin: 2.7 g/dL — ABNORMAL LOW (ref 3.5–5.0)
Alkaline Phosphatase: 200 U/L — ABNORMAL HIGH (ref 38–126)
Anion gap: 14 (ref 5–15)
BUN: <5 mg/dL — ABNORMAL LOW (ref 6–20)
CO2: 18 mmol/L — ABNORMAL LOW (ref 22–32)
Calcium: 8.8 mg/dL — ABNORMAL LOW (ref 8.9–10.3)
Chloride: 106 mmol/L (ref 98–111)
Creatinine, Ser: 0.61 mg/dL (ref 0.44–1.00)
GFR calc Af Amer: >60 mL/min (ref >60)
GFR calc non Af Amer: >60 mL/min (ref >60)
Glucose, Bld: 75 mg/dL (ref 70–99)
Potassium: 3.8 mmol/L (ref 3.5–5.1)
Sodium: 138 mmol/L (ref 135–145)
Total Bilirubin: 0.7 mg/dL (ref 0.3–1.2)
Total Protein: 7 g/dL (ref 6.5–8.1)

## 2018-12-28 LAB — CBC
HCT: 33.6 % — ABNORMAL LOW (ref 36.0–46.0)
Hemoglobin: 10.8 g/dL — ABNORMAL LOW (ref 12.0–15.0)
MCH: 24.8 pg — ABNORMAL LOW (ref 26.0–34.0)
MCHC: 32.1 g/dL (ref 30.0–36.0)
MCV: 77.1 fL — ABNORMAL LOW (ref 80.0–100.0)
Platelets: 369 10*3/uL (ref 150–400)
RBC: 4.36 MIL/uL (ref 3.87–5.11)
RDW: 13.5 % (ref 11.5–15.5)
WBC: 8.5 10*3/uL (ref 4.0–10.5)
nRBC: 0 % (ref 0.0–0.2)

## 2018-12-28 LAB — ABO/RH: ABO/RH(D): A NEG

## 2018-12-28 LAB — LACTATE DEHYDROGENASE: LDH: 117 U/L (ref 98–192)

## 2018-12-28 LAB — PROTEIN / CREATININE RATIO, URINE
Creatinine, Urine: 125.42 mg/dL
Protein Creatinine Ratio: 0.09 mg/mg{Cre} (ref 0.00–0.15)
Total Protein, Urine: 11 mg/dL

## 2018-12-28 LAB — TYPE AND SCREEN
ABO/RH(D): A NEG
Antibody Screen: NEGATIVE

## 2018-12-28 LAB — URIC ACID: Uric Acid, Serum: 5.2 mg/dL (ref 2.5–7.1)

## 2018-12-28 LAB — SARS CORONAVIRUS 2 BY RT PCR (HOSPITAL ORDER, PERFORMED IN ~~LOC~~ HOSPITAL LAB): SARS Coronavirus 2: NEGATIVE

## 2018-12-28 MED ORDER — OXYCODONE-ACETAMINOPHEN 5-325 MG PO TABS: 1.0000 | ORAL_TABLET | ORAL | Status: DC | PRN

## 2018-12-28 MED ORDER — OXYTOCIN 40 UNITS IN NORMAL SALINE INFUSION - SIMPLE MED
2.5000 [IU]/h | INTRAVENOUS | Status: DC
  Administered 2018-12-28: 2.5 [IU]/h via INTRAVENOUS
  Filled 2018-12-28: qty 1000

## 2018-12-28 MED ORDER — ACETAMINOPHEN 325 MG PO TABS: 650.0000 mg | ORAL_TABLET | ORAL | Status: DC | PRN

## 2018-12-28 MED ORDER — OXYTOCIN 10 UNIT/ML IJ SOLN: 10.0000 [IU] | Freq: Once | INTRAMUSCULAR | Status: DC

## 2018-12-28 MED ORDER — IBUPROFEN 600 MG PO TABS
600.0000 mg | ORAL_TABLET | Freq: Four times a day (QID) | ORAL | Status: DC
  Administered 2018-12-29 – 2018-12-30 (×7): 600 mg via ORAL
  Filled 2018-12-28 (×7): qty 1

## 2018-12-28 MED ORDER — FENTANYL-BUPIVACAINE-NACL 0.5-0.125-0.9 MG/250ML-% EP SOLN
12.0000 mL/h | EPIDURAL | Status: DC | PRN
  Filled 2018-12-28: qty 250

## 2018-12-28 MED ORDER — LIDOCAINE-EPINEPHRINE (PF) 2 %-1:200000 IJ SOLN
INTRAMUSCULAR | Status: DC | PRN
  Administered 2018-12-28 (×2): 2 mL via EPIDURAL

## 2018-12-28 MED ORDER — DIPHENHYDRAMINE HCL 50 MG/ML IJ SOLN: 12.5000 mg | INTRAMUSCULAR | Status: DC | PRN

## 2018-12-28 MED ORDER — PHENYLEPHRINE 40 MCG/ML (10ML) SYRINGE FOR IV PUSH (FOR BLOOD PRESSURE SUPPORT)
80.0000 ug | PREFILLED_SYRINGE | INTRAVENOUS | Status: DC | PRN
  Filled 2018-12-28: qty 10

## 2018-12-28 MED ORDER — PENICILLIN G 3 MILLION UNITS IVPB - SIMPLE MED
3.0000 10*6.[IU] | INTRAVENOUS | Status: DC
  Administered 2018-12-28 (×2): 3 10*6.[IU] via INTRAVENOUS
  Filled 2018-12-28 (×3): qty 100

## 2018-12-28 MED ORDER — OXYTOCIN BOLUS FROM INFUSION
500.0000 mL | Freq: Once | INTRAVENOUS | Status: AC
  Administered 2018-12-28: 500 mL via INTRAVENOUS

## 2018-12-28 MED ORDER — LACTATED RINGERS IV SOLN
500.0000 mL | INTRAVENOUS | Status: DC | PRN
  Administered 2018-12-28: 500 mL via INTRAVENOUS

## 2018-12-28 MED ORDER — OXYCODONE-ACETAMINOPHEN 5-325 MG PO TABS: 2.0000 | ORAL_TABLET | ORAL | Status: DC | PRN

## 2018-12-28 MED ORDER — LIDOCAINE HCL (PF) 1 % IJ SOLN: 30.0000 mL | INTRAMUSCULAR | Status: DC | PRN

## 2018-12-28 MED ORDER — TERBUTALINE SULFATE 1 MG/ML IJ SOLN: 0.2500 mg | Freq: Once | INTRAMUSCULAR | Status: DC | PRN

## 2018-12-28 MED ORDER — EPHEDRINE 5 MG/ML INJ: 10.0000 mg | INTRAVENOUS | Status: DC | PRN

## 2018-12-28 MED ORDER — FLEET ENEMA 7-19 GM/118ML RE ENEM: 1.0000 | ENEMA | RECTAL | Status: DC | PRN

## 2018-12-28 MED ORDER — LACTATED RINGERS IV SOLN: 500.0000 mL | Freq: Once | INTRAVENOUS | Status: DC

## 2018-12-28 MED ORDER — LACTATED RINGERS IV SOLN
INTRAVENOUS | Status: DC
  Administered 2018-12-28 (×2): 125 mL/h via INTRAVENOUS

## 2018-12-28 MED ORDER — SODIUM CHLORIDE (PF) 0.9 % IJ SOLN
INTRAMUSCULAR | Status: DC | PRN
  Administered 2018-12-28: 12 mL/h via EPIDURAL

## 2018-12-28 MED ORDER — OXYTOCIN 40 UNITS IN NORMAL SALINE INFUSION - SIMPLE MED
1.0000 m[IU]/min | INTRAVENOUS | Status: DC
  Administered 2018-12-28: 8 m[IU]/min via INTRAVENOUS
  Administered 2018-12-28: 14:00:00 2 m[IU]/min via INTRAVENOUS

## 2018-12-28 MED ORDER — SODIUM CHLORIDE 0.9 % IV SOLN
5.0000 10*6.[IU] | Freq: Once | INTRAVENOUS | Status: AC
  Administered 2018-12-28: 14:00:00 5 10*6.[IU] via INTRAVENOUS
  Filled 2018-12-28: qty 5

## 2018-12-28 MED ORDER — SOD CITRATE-CITRIC ACID 500-334 MG/5ML PO SOLN: 30.0000 mL | ORAL | Status: DC | PRN

## 2018-12-28 MED ORDER — ONDANSETRON HCL 4 MG/2ML IJ SOLN: 4.0000 mg | Freq: Four times a day (QID) | INTRAMUSCULAR | Status: DC | PRN

## 2018-12-28 MED ORDER — PHENYLEPHRINE 40 MCG/ML (10ML) SYRINGE FOR IV PUSH (FOR BLOOD PRESSURE SUPPORT): 80.0000 ug | PREFILLED_SYRINGE | INTRAVENOUS | Status: DC | PRN

## 2018-12-28 NOTE — Anesthesia Preprocedure Evaluation (Signed)
Anesthesia Evaluation  Patient identified by MRN, date of birth, ID band Patient awake    Reviewed: Allergy & Precautions, NPO status , Patient's Chart, lab work & pertinent test results  Airway Mallampati: II  TM Distance: >3 FB Neck ROM: Full    Dental no notable dental hx.    Pulmonary neg pulmonary ROS, former smoker,    Pulmonary exam normal breath sounds clear to auscultation       Cardiovascular negative cardio ROS Normal cardiovascular exam Rhythm:Regular Rate:Normal     Neuro/Psych negative neurological ROS  negative psych ROS   GI/Hepatic negative GI ROS, Neg liver ROS,   Endo/Other  negative endocrine ROS  Renal/GU negative Renal ROS  negative genitourinary   Musculoskeletal negative musculoskeletal ROS (+)   Abdominal   Peds  Hematology negative hematology ROS (+)   Anesthesia Other Findings IOL for gHTN  Reproductive/Obstetrics (+) Pregnancy                             Anesthesia Physical Anesthesia Plan  ASA: III  Anesthesia Plan: Epidural   Post-op Pain Management:    Induction:   PONV Risk Score and Plan: Treatment may vary due to age or medical condition  Airway Management Planned: Natural Airway  Additional Equipment:   Intra-op Plan:   Post-operative Plan:   Informed Consent: I have reviewed the patients History and Physical, chart, labs and discussed the procedure including the risks, benefits and alternatives for the proposed anesthesia with the patient or authorized representative who has indicated his/her understanding and acceptance.       Plan Discussed with: Anesthesiologist  Anesthesia Plan Comments: (Patient identified. Risks, benefits, options discussed with patient including but not limited to bleeding, infection, nerve damage, paralysis, failed block, incomplete pain control, headache, blood pressure changes, nausea, vomiting, reactions to  medication, itching, and post partum back pain. Confirmed with bedside nurse the patient's most recent platelet count. Confirmed with the patient that they are not taking any anticoagulation, have any bleeding history or any family history of bleeding disorders. Patient expressed understanding and wishes to proceed. All questions were answered. )        Anesthesia Quick Evaluation

## 2018-12-28 NOTE — Progress Notes (Signed)
OB Note SVE4/50/-2, AROM with clear fluid BP 126/79 (BP Location: Right Arm)   Pulse (!) 104   Temp 98.8 F (37.1 C) (Oral)   Resp 16   Ht 5\' 3"  (1.6 m)   Wt 100.8 kg   SpO2 100%   BMI 39.36 kg/m  NST Cat 1, Toco q4m Cont to titrate pit prn Yudit Modesitt [name]  6:10 PM 12/28/18

## 2018-12-28 NOTE — Anesthesia Procedure Notes (Signed)
Epidural Patient location during procedure: OB Start time: 12/28/2018 5:20 PM End time: 12/28/2018 5:35 PM  Staffing Anesthesiologist: Freddrick March, MD Performed: anesthesiologist   Preanesthetic Checklist Completed: patient identified, pre-op evaluation, timeout performed, IV checked, risks and benefits discussed and monitors and equipment checked  Epidural Patient position: sitting Prep: site prepped and draped and DuraPrep Patient monitoring: continuous pulse ox, blood pressure, heart rate and cardiac monitor Approach: midline Location: L3-L4 Injection technique: LOR air  Needle:  Needle type: Tuohy  Needle gauge: 17 G Needle length: 9 cm Needle insertion depth: 6 cm Catheter type: closed end flexible Catheter size: 19 Gauge Catheter at skin depth: 12 cm Test dose: negative  Assessment Sensory level: T8 Events: blood not aspirated, injection not painful, no injection resistance, negative IV test and no paresthesia  Additional Notes Patient identified. Risks/Benefits/Options discussed with patient including but not limited to bleeding, infection, nerve damage, paralysis, failed block, incomplete pain control, headache, blood pressure changes, nausea, vomiting, reactions to medication both or allergic, itching and postpartum back pain. Confirmed with bedside nurse the patient's most recent platelet count. Confirmed with patient that they are not currently taking any anticoagulation, have any bleeding history or any family history of bleeding disorders. Patient expressed understanding and wished to proceed. All questions were answered. Sterile technique was used throughout the entire procedure. Please see nursing notes for vital signs. Test dose was given through epidural catheter and negative prior to continuing to dose epidural or start infusion. Warning signs of high block given to the patient including shortness of breath, tingling/numbness in hands, complete motor block,  or any concerning symptoms with instructions to call for help. Patient was given instructions on fall risk and not to get out of bed. All questions and concerns addressed with instructions to call with any issues or inadequate analgesia.  Reason for block:procedure for pain

## 2018-12-28 NOTE — Progress Notes (Signed)
OB Note Gabriella Peters 23 y.o. [id number] at [redacted]w[redacted]d here for IOL 2/2 ghtn Reports ctx are more painful. Denies any other concerns.  Vitals:   12/28/18 1430 12/28/18 1500 12/28/18 1530 12/28/18 1600  BP: (!) 120/96  127/85 (!) 131/95  Pulse: 96  (!) 103 79  Resp: 16 18 18 18   Temp:      TempSrc:      Weight:      Height:       Recent Labs    12/28/18 1258  WBC 8.5  HGB 10.8*  HCT 33.6*  PLT 369  NA 138  K 3.8  CL 106  BUN <5*  CREATININE 0.61  AST 16  ALT 13  BILITOT 0.7  UPC 0.09, uric acid 5.2, LDH 117 COVID screen neg, RPR  Pending, A neg  Fetal testing: Cat 1, Toco q54m  A/P 23 y.o. [id number] at [redacted]w[redacted]d presents for IOL 2/2 GHTN 1. IOL: on admit 3/50/ballotable, started pitocin 1:30pm, will plan for epidural and then recheck, AROM prn 2. GHTN: Mild range BP in clinic and at [facility] at [redacted]w[redacted]d. Mild range on admit, Pree labs normal on admit, asymptomatic. Plan to monitor, if severe s/sx will treat Bps prn and start mag.  3. Trich in pregnancy sp treatment at [facility]. Wet prep neg at 37w. Negative GC/CT and Negative Hep B/HepC/Syphilis/HIV 11/16/2018.  4. Rh neg, sp rhogam at 28w.   GBS+ on PCN/cephalic/EFW7# Gabriella Peters [name] 12/28/18  4:41 PM

## 2018-12-28 NOTE — H&P (Signed)
Gabriella Peters is a 23 y.o. female [phone] at [redacted]w[redacted]d presenting for IOL 2/2 GHTN. Newly diagnosed GHTN, mild range BP at [facility] 8/22 and then again in clinic today. She denies HA/VC/SOB. Reports mild RUQ and LUQ pain. Reports irregular contractions, no LOF, no VB. Normal FM.   ROS per HPI  OB History    Gravida  4   Para  1   Term  0   Preterm  1   AB  2   Living  1     SAB  2   TAB  0   Ectopic  0   Multiple  0   Live Births  1          Past Medical History:  Diagnosis Date  . Pyelonephritis complicating pregnancy in third trimester 2014  . Urinary tract infection 2914   Past Surgical History:  Procedure Laterality Date  . NO PAST SURGERIES     Family History: family history includes Diabetes in her father; Heart disease in her maternal grandfather and mother; Hypertension in her mother. Social History:  reports that she quit smoking about 6 years ago. Her smoking use included cigarettes. She has never used smokeless tobacco. She reports that she does not drink alcohol or use drugs.     Maternal Diabetes: No Genetic Screening: Declined all testing except for CF which was negative Maternal Ultrasounds/Referrals: Normal  Last scan - anatomy scan, normal. Posterior placenta Fetal Ultrasounds or other Referrals:  None Maternal Substance Abuse:  No Significant Maternal Medications:  None Significant Maternal Lab Results:  Group B Strep positive Other Comments:  Pregnancy c/b GHTN, Trich in pregnancy sp treatment at 34w, Rh neg  Exam Physical Exam  Dilation: 3 Effacement (%): 50 Station: Ballotable Exam by:: MD Taam Blood pressure 127/83, pulse 100, temperature 98.7 F (37.1 C), temperature source Oral, resp. rate 16, height 5\' 3"  (1.6 m), weight 100.8 kg, unknown if currently breastfeeding. [place]: cephalic  NST FHR 423, +accels, no decels Toco q37m  CBC Latest Ref Rng & Units 12/28/2018 11/27/2018 05/25/2018  WBC 4.0 - 10.5 K/uL 8.5 10.8(H) -  Hemoglobin  12.0 - 15.0 g/dL 10.8(L) 10.8(L) 12.9  Hematocrit 36.0 - 46.0 % 33.6(L) 34.7(L) 42(A)  Platelets 150 - 400 K/uL 369 370 332   CMP, Uric acid, LDH, UPC pending COVID screen pending  Prenatal labs: ABO, Rh:   Antibody:   Rubella: Immune (02/18 0000) RPR: Nonreactive (08/11 0000)  HBsAg: Negative (08/11 0000)  HIV: Non reactive (08/11 0000)  GBS: Positive/-- (08/31 0000)   Assessment/Plan: Gabriella Peters 23 y.o. [phone] at [redacted]w[redacted]d presents for IOL 2/2 GHTN 1. IOL: on admit 3/50/ballotable, plan for pitocin and then AROM prn. History of 1 prior SVD at [redacted]w[redacted]d 2/2 PTL, uncomplicated. Vertex by BSUS, 7lb by lepolds. Desires epidural prior to AROM. GBS+ on PCN.  2. GHTN: Mild range BP in clinic and at [facility] at [redacted]w[redacted]d. Pree labs pending on admit, asymptomatic except for mild RUQ pain. Plan to monitor, if severe s/sx will treat Bps prn and start mag.  3. Trich in pregnancy sp treatment at [facility]. Wet prep neg at 37w. Negative GC/CT and Negative Hep B/HepC/Syphilis/HIV 11/16/2018.  4. Rh neg, sp rhogam at 28w.    Gabriella Peters 12/28/2018, 1:45 PM

## 2018-12-29 ENCOUNTER — Encounter (HOSPITAL_COMMUNITY): Payer: Self-pay

## 2018-12-29 LAB — CBC
HCT: 29.2 % — ABNORMAL LOW (ref 36.0–46.0)
Hemoglobin: 9.4 g/dL — ABNORMAL LOW (ref 12.0–15.0)
MCH: 24.8 pg — ABNORMAL LOW (ref 26.0–34.0)
MCHC: 32.2 g/dL (ref 30.0–36.0)
MCV: 77 fL — ABNORMAL LOW (ref 80.0–100.0)
Platelets: 303 10*3/uL (ref 150–400)
RBC: 3.79 MIL/uL — ABNORMAL LOW (ref 3.87–5.11)
RDW: 13.8 % (ref 11.5–15.5)
WBC: 11.6 10*3/uL — ABNORMAL HIGH (ref 4.0–10.5)
nRBC: 0 % (ref 0.0–0.2)

## 2018-12-29 LAB — HIV ANTIBODY (ROUTINE TESTING W REFLEX): HIV Screen 4th Generation wRfx: NONREACTIVE

## 2018-12-29 LAB — RPR: RPR Ser Ql: NONREACTIVE

## 2018-12-29 MED ORDER — OXYCODONE HCL 5 MG PO TABS: 5.0000 mg | ORAL_TABLET | ORAL | Status: DC | PRN

## 2018-12-29 MED ORDER — OXYCODONE HCL 5 MG PO TABS: 10.0000 mg | ORAL_TABLET | ORAL | Status: DC | PRN

## 2018-12-29 MED ORDER — OXYTOCIN BOLUS FROM INFUSION: 500.0000 mL | Freq: Once | INTRAVENOUS | Status: DC

## 2018-12-29 MED ORDER — TETANUS-DIPHTH-ACELL PERTUSSIS 5-2.5-18.5 LF-MCG/0.5 IM SUSP: 0.5000 mL | Freq: Once | INTRAMUSCULAR | Status: DC

## 2018-12-29 MED ORDER — RHO D IMMUNE GLOBULIN 1500 UNIT/2ML IJ SOSY
300.0000 ug | PREFILLED_SYRINGE | Freq: Once | INTRAMUSCULAR | Status: DC
  Filled 2018-12-29: qty 2

## 2018-12-29 MED ORDER — SODIUM CHLORIDE 0.9% FLUSH: 3.0000 mL | Freq: Two times a day (BID) | INTRAVENOUS | Status: DC

## 2018-12-29 MED ORDER — OXYTOCIN 40 UNITS IN NORMAL SALINE INFUSION - SIMPLE MED: 2.5000 [IU]/h | INTRAVENOUS | Status: DC | PRN

## 2018-12-29 MED ORDER — COCONUT OIL OIL: 1.0000 "application " | TOPICAL_OIL | Status: DC | PRN

## 2018-12-29 MED ORDER — PRENATAL MULTIVITAMIN CH
1.0000 | ORAL_TABLET | Freq: Every day | ORAL | Status: DC
  Administered 2018-12-29 – 2018-12-30 (×2): 1 via ORAL
  Filled 2018-12-29 (×2): qty 1

## 2018-12-29 MED ORDER — RHO D IMMUNE GLOBULIN 1500 UNIT/2ML IJ SOSY
300.0000 ug | PREFILLED_SYRINGE | Freq: Once | INTRAMUSCULAR | Status: AC
  Administered 2018-12-29: 300 ug via INTRAVENOUS
  Filled 2018-12-29: qty 2

## 2018-12-29 MED ORDER — ONDANSETRON HCL 4 MG/2ML IJ SOLN: 4.0000 mg | INTRAMUSCULAR | Status: DC | PRN

## 2018-12-29 MED ORDER — SODIUM CHLORIDE 0.9 % IV SOLN: 250.0000 mL | INTRAVENOUS | Status: DC | PRN

## 2018-12-29 MED ORDER — ACETAMINOPHEN 325 MG PO TABS
650.0000 mg | ORAL_TABLET | ORAL | Status: DC | PRN
  Administered 2018-12-29: 650 mg via ORAL
  Filled 2018-12-29: qty 2

## 2018-12-29 MED ORDER — DIPHENHYDRAMINE HCL 25 MG PO CAPS: 25.0000 mg | ORAL_CAPSULE | Freq: Four times a day (QID) | ORAL | Status: DC | PRN

## 2018-12-29 MED ORDER — SIMETHICONE 80 MG PO CHEW: 80.0000 mg | CHEWABLE_TABLET | ORAL | Status: DC | PRN

## 2018-12-29 MED ORDER — WITCH HAZEL-GLYCERIN EX PADS: 1.0000 "application " | MEDICATED_PAD | CUTANEOUS | Status: DC | PRN

## 2018-12-29 MED ORDER — DIBUCAINE (PERIANAL) 1 % EX OINT: 1.0000 "application " | TOPICAL_OINTMENT | CUTANEOUS | Status: DC | PRN

## 2018-12-29 MED ORDER — ONDANSETRON HCL 4 MG PO TABS: 4.0000 mg | ORAL_TABLET | ORAL | Status: DC | PRN

## 2018-12-29 MED ORDER — BENZOCAINE-MENTHOL 20-0.5 % EX AERO: 1.0000 "application " | INHALATION_SPRAY | CUTANEOUS | Status: DC | PRN

## 2018-12-29 MED ORDER — SENNOSIDES-DOCUSATE SODIUM 8.6-50 MG PO TABS
2.0000 | ORAL_TABLET | ORAL | Status: DC
  Administered 2018-12-29 (×2): 2 via ORAL
  Filled 2018-12-29 (×2): qty 2

## 2018-12-29 MED ORDER — SODIUM CHLORIDE 0.9% FLUSH: 3.0000 mL | INTRAVENOUS | Status: DC | PRN

## 2018-12-29 NOTE — Lactation Note (Signed)
This note was copied from a baby's chart. Lactation Consultation Note Baby 5 hrs old. Mom states baby is latching great and feeding well. Mom BF her now 23 yr old for 1 yr w/o difficulty. Mom states she is latching well. Mom states that it hurt a little on the Rt. Once because she wasn't as deep. Discussed positioning and support. Mom has short shaft compressible nipples. Mom demonstrated hand expression w/easy flow of colostrum. Mom stated she has some frozen milk in freezer that she has been pumping since 32 weeks. Discussed STS, I&O, feeding habits, breast massage, supply and demand. Mom encouraged to feed baby 8-12 times/24 hours and with feeding cues.  Encouraged mom to call for assistance or questions,. Lactation brochure given.  Patient Name: Gabriella Peters Today's Date: 12/29/2018 Reason for consult: Initial assessment;Early term 37-38.6wks   Maternal Data Has patient been taught Hand Expression?: Yes Does the patient have breastfeeding experience prior to this delivery?: Yes  Feeding Feeding Type: Breast Fed  LATCH Score       Type of Nipple: Everted at rest and after stimulation(short shaft)  Comfort (Breast/Nipple): Soft / non-tender        Interventions Interventions: Breast feeding basics reviewed;Position options;Breast massage;Hand express;Breast compression  Lactation Tools Discussed/Used WIC Program: No   Consult Status Consult Status: Follow-up Date: 12/30/18 Follow-up type: In-patient    Theodoro Kalata 12/29/2018, 4:32 AM

## 2018-12-29 NOTE — Anesthesia Postprocedure Evaluation (Signed)
Anesthesia Post Note  Patient: Gabriella Peters  Procedure(s) Performed: AN AD [place]     Patient location during evaluation: Mother Baby Anesthesia Type: Epidural Level of consciousness: awake and alert Pain management: pain level controlled Vital Signs Assessment: post-procedure vital signs reviewed and stable Respiratory status: spontaneous breathing, nonlabored ventilation and respiratory function stable Cardiovascular status: stable Postop Assessment: no headache, no backache, epidural receding, no apparent nausea or vomiting, patient able to bend at knees, adequate PO intake and able to ambulate Anesthetic complications: no    Last Vitals:  Vitals:   12/29/18 0130 12/29/18 0543  BP: 121/69 116/74  Pulse: 100 87  Resp: 16 16  Temp: (!) 36.4 C 36.9 C  SpO2: 98% 98%    Last Pain:  Vitals:   12/29/18 0543  TempSrc: Oral  PainSc: 0-No pain   Pain Goal:                   [employer]

## 2018-12-29 NOTE — Progress Notes (Signed)
Post Partum Day 1 Subjective: no complaints, up ad lib, voiding and tolerating PO  Objective: Blood pressure 120/72, pulse 98, temperature 98.8 F (37.1 C), temperature source Oral, resp. rate 16, height 5\' 3"  (1.6 m), weight 100.8 kg, SpO2 99 %, unknown if currently breastfeeding.  Physical Exam:  General: alert, cooperative and appears stated age 23: appropriate Uterine Fundus: firm DVT Evaluation: No evidence of DVT seen on physical exam.  Recent Labs    12/28/18 1258 12/29/18 0550  HGB 10.8* 9.4*  HCT 33.6* 29.2*    Assessment/Plan: Plan for discharge tomorrow   LOS: 1 day   Vanessa Kick 12/29/2018, 6:46 PM

## 2018-12-30 LAB — RH IG WORKUP (INCLUDES ABO/RH)
ABO/RH(D): A NEG
Fetal Screen: NEGATIVE
Gestational Age(Wks): 38.5
Unit division: 0

## 2018-12-30 NOTE — Lactation Note (Signed)
This note was copied from a baby's chart. Lactation Consultation Note  Patient Name: Gabriella Peters Today's Date: 12/30/2018   Mom says that latches feel "pinchy" & there is a compression stripe on her R nipple.   Mom reports having had an abundant supply with her 1st child (it sounds as if her 1st child would cough/choke at the breast). Mom has been pumping since [redacted] weeks gestation & says she could get 1.5 oz when she was pumping antenatally.   Infant just fed recently & appears content. Mom will call us for next feeding & we will try laid-back nursing to see if that allows for a more comfortable feeding for Mom.   Matthias Hughs [facility] 12/30/2018, 10:06 AM

## 2018-12-30 NOTE — Discharge Summary (Signed)
Obstetric Discharge Summary Reason for Admission: induction of labor Prenatal Procedures: NST Intrapartum Procedures: spontaneous vaginal delivery Postpartum Procedures: Rho(D) Ig Complications-Operative and Postpartum: 1 degree perineal laceration Hemoglobin  Date Value Ref Range Status  12/29/2018 9.4 (L) 12.0 - 15.0 g/dL Final  05/25/2018 12.9  Final   HCT  Date Value Ref Range Status  12/29/2018 29.2 (L) 36.0 - 46.0 % Final  05/25/2018 42 (A) 29 - 41 Final    Discharge Diagnoses: Term Pregnancy-delivered  Discharge Information: Date: 12/30/2018 Activity: pelvic rest Diet: routine Medications: Ibuprofen and Iron Condition: stable Instructions: refer to practice specific booklet Discharge to: home Follow-up Information    Taam-Akelman, Lawrence Santiago, MD Follow up in 4 week(s).   Specialty: Obstetrics and Gynecology Contact information: [place] [address] [phone]           Newborn Data: Live born female  Birth Weight: 7 lb 5.5 oz (3330 g) APGAR: 6, 9  Newborn Delivery   Time head delivered: 12/28/2018 22:36:00 Birth date/time: 12/28/2018 22:36:00 Delivery type: Vaginal, Spontaneous      Home with mother.  Daria Pastures 12/30/2018, 6:34 AM

## 2018-12-30 NOTE — Progress Notes (Signed)
Patient is eating, ambulating, voiding.  Pain control is good.  Vitals:   12/29/18 1224 12/29/18 1509 12/29/18 2216 12/30/18 0524  BP: 124/81 120/72 125/78 119/79  Pulse: 96 98 85 74  Resp: 16 16 16 18   Temp: 98 F (36.7 C) 98.8 F (37.1 C) 97.8 F (36.6 C) 98 F (36.7 C)  TempSrc: Oral Oral Oral Oral  SpO2: 99% 99% 99% 100%  Weight:      Height:        Fundus firm Perineum without swelling.  Lab Results  Component Value Date   WBC 11.6 (H) 12/29/2018   HGB 9.4 (L) 12/29/2018   HCT 29.2 (L) 12/29/2018   MCV 77.0 (L) 12/29/2018   PLT 303 12/29/2018    --/--/A NEG (09/23 0759)/RI  A/P Post partum day 2.  BAby RH pos-Rhogam.Iron.  Routine care.  Expect d/c today.    Daria Pastures
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
## 2021-02-15 DIAGNOSIS — O09892 Supervision of other high risk pregnancies, second trimester: Secondary | ICD-10-CM | POA: Insufficient documentation

## 2021-02-15 LAB — OB RESULTS CONSOLE HIV ANTIBODY (ROUTINE TESTING): HIV: NONREACTIVE

## 2021-02-15 LAB — OB RESULTS CONSOLE VARICELLA ZOSTER ANTIBODY, IGG: Varicella: IMMUNE

## 2021-02-15 LAB — OB RESULTS CONSOLE RPR: RPR: NONREACTIVE

## 2021-02-15 LAB — OB RESULTS CONSOLE HEPATITIS B SURFACE ANTIGEN: Hepatitis B Surface Ag: NEGATIVE

## 2021-02-15 LAB — OB RESULTS CONSOLE TSH: TSH: 1.15

## 2021-02-15 LAB — OB RESULTS CONSOLE RUBELLA ANTIBODY, IGM: Rubella: IMMUNE

## 2021-02-18 LAB — OB RESULTS CONSOLE GC/CHLAMYDIA
Chlamydia: NEGATIVE
Gonorrhea: NEGATIVE

## 2021-03-11 LAB — HEPATITIS C ANTIBODY: HCV Ab: NEGATIVE

## 2021-03-11 LAB — OB RESULTS CONSOLE TSH: TSH: 1.15

## 2021-04-21 ENCOUNTER — Observation Stay
Admission: EM | Admit: 2021-04-21 | Discharge: 2021-04-21 | Disposition: A | Discharge status: Home / Self Care | Payer: Medicaid Other | Attending: Certified Nurse Midwife | Admitting: Certified Nurse Midwife

## 2021-04-21 ENCOUNTER — Encounter: Payer: Self-pay | Admitting: Obstetrics and Gynecology

## 2021-04-21 ENCOUNTER — Other Ambulatory Visit: Payer: Self-pay

## 2021-04-21 DIAGNOSIS — O212 Late vomiting of pregnancy: Principal | ICD-10-CM | POA: Insufficient documentation

## 2021-04-21 DIAGNOSIS — R197 Diarrhea, unspecified: Secondary | ICD-10-CM | POA: Diagnosis not present

## 2021-04-21 DIAGNOSIS — Z87891 Personal history of nicotine dependence: Secondary | ICD-10-CM | POA: Diagnosis not present

## 2021-04-21 DIAGNOSIS — Z3A22 22 weeks gestation of pregnancy: Secondary | ICD-10-CM | POA: Insufficient documentation

## 2021-04-21 DIAGNOSIS — R109 Unspecified abdominal pain: Secondary | ICD-10-CM

## 2021-04-21 DIAGNOSIS — R112 Nausea with vomiting, unspecified: Secondary | ICD-10-CM | POA: Diagnosis present

## 2021-04-21 DIAGNOSIS — O99612 Diseases of the digestive system complicating pregnancy, second trimester: Secondary | ICD-10-CM | POA: Diagnosis not present

## 2021-04-21 LAB — CBC
HCT: 33.6 % — ABNORMAL LOW (ref 36.0–46.0)
Hemoglobin: 10.7 g/dL — ABNORMAL LOW (ref 12.0–15.0)
MCH: 24.4 pg — ABNORMAL LOW (ref 26.0–34.0)
MCHC: 31.8 g/dL (ref 30.0–36.0)
MCV: 76.5 fL — ABNORMAL LOW (ref 80.0–100.0)
Platelets: 387 10*3/uL (ref 150–400)
RBC: 4.39 MIL/uL (ref 3.87–5.11)
RDW: 14.8 % (ref 11.5–15.5)
WBC: 11.6 10*3/uL — ABNORMAL HIGH (ref 4.0–10.5)
nRBC: 0 % (ref 0.0–0.2)

## 2021-04-21 LAB — COMPREHENSIVE METABOLIC PANEL
ALT: 14 U/L (ref 0–44)
AST: 15 U/L (ref 15–41)
Albumin: 3 g/dL — ABNORMAL LOW (ref 3.5–5.0)
Alkaline Phosphatase: 84 U/L (ref 38–126)
Anion gap: 8 (ref 5–15)
BUN: 6 mg/dL (ref 6–20)
CO2: 20 mmol/L — ABNORMAL LOW (ref 22–32)
Calcium: 8.8 mg/dL — ABNORMAL LOW (ref 8.9–10.3)
Chloride: 107 mmol/L (ref 98–111)
Creatinine, Ser: 0.5 mg/dL (ref 0.44–1.00)
GFR, Estimated: >60 mL/min (ref >60)
Glucose, Bld: 101 mg/dL — ABNORMAL HIGH (ref 70–99)
Potassium: 3.9 mmol/L (ref 3.5–5.1)
Sodium: 135 mmol/L (ref 135–145)
Total Bilirubin: 0.3 mg/dL (ref 0.3–1.2)
Total Protein: 7.1 g/dL (ref 6.5–8.1)

## 2021-04-21 MED ORDER — LIDOCAINE VISCOUS HCL 2 % MT SOLN
15.0000 mL | Freq: Once | OROMUCOSAL | Status: AC
  Administered 2021-04-21: 15 mL via ORAL
  Filled 2021-04-21: qty 15

## 2021-04-21 MED ORDER — SIMETHICONE 80 MG PO CHEW: 80.0000 mg | CHEWABLE_TABLET | Freq: Four times a day (QID) | ORAL | 0 refills | Status: DC | PRN

## 2021-04-21 MED ORDER — DOCUSATE SODIUM 100 MG PO CAPS: 100.0000 mg | ORAL_CAPSULE | Freq: Every day | ORAL | Status: DC

## 2021-04-21 MED ORDER — ACETAMINOPHEN 325 MG PO TABS
650.0000 mg | ORAL_TABLET | ORAL | Status: AC | PRN
Start: 2021-04-21 — End: ?

## 2021-04-21 MED ORDER — ONDANSETRON HCL 4 MG/2ML IJ SOLN
4.0000 mg | Freq: Four times a day (QID) | INTRAMUSCULAR | Status: DC | PRN
  Administered 2021-04-21: 4 mg via INTRAVENOUS
  Filled 2021-04-21: qty 2

## 2021-04-21 MED ORDER — LACTATED RINGERS IV SOLN: INTRAVENOUS | Status: AC

## 2021-04-21 MED ORDER — LIDOCAINE VISCOUS HCL 2 % MT SOLN: 15.0000 mL | Freq: Four times a day (QID) | OROMUCOSAL | 0 refills | Status: DC | PRN

## 2021-04-21 MED ORDER — OXYTOCIN-SODIUM CHLORIDE 30-0.9 UT/500ML-% IV SOLN
INTRAVENOUS | Status: AC
  Filled 2021-04-21: qty 500

## 2021-04-21 MED ORDER — PRENATAL MULTIVITAMIN CH
1.0000 | ORAL_TABLET | Freq: Every day | ORAL | Status: AC
Start: 2021-04-21 — End: ?

## 2021-04-21 MED ORDER — SIMETHICONE 80 MG PO CHEW: 80.0000 mg | CHEWABLE_TABLET | Freq: Once | ORAL | Status: DC

## 2021-04-21 MED ORDER — CALCIUM CARBONATE ANTACID 500 MG PO CHEW: 2.0000 | CHEWABLE_TABLET | ORAL | Status: AC | PRN

## 2021-04-21 MED ORDER — ACETAMINOPHEN 325 MG PO TABS: 650.0000 mg | ORAL_TABLET | ORAL | Status: DC | PRN

## 2021-04-21 MED ORDER — CALCIUM CARBONATE ANTACID 500 MG PO CHEW
2.0000 | CHEWABLE_TABLET | ORAL | Status: DC | PRN
  Administered 2021-04-21: 400 mg via ORAL
  Filled 2021-04-21: qty 2

## 2021-04-21 MED ORDER — PRENATAL MULTIVITAMIN CH: 1.0000 | ORAL_TABLET | Freq: Every day | ORAL | Status: DC

## 2021-04-21 NOTE — Discharge Summary (Signed)
Gabriella Peters is a 26 y.o. female. She is at [redacted]w[redacted]d gestation. Patient's last menstrual period was 11/12/2020. Estimated Date of Delivery: 08/19/21  Prenatal care site: was seen at [facility] at 14wks, moved to [facility] and has not been seen, has prenatal appointment scheduled in [place] in February.  Current pregnancy complicated by:  - inadequate prenatal care (has not been seen since 14wks) - blood type Rh negative - obesity in pregnancy - history of gHTN - history of shoulder dystocia - stomach pain - closely spaced pregnancies (last baby born 06/2020)  Chief complaint: stomach pain when eating, followed by nausea, vomiting, and diarrhea. This has been happening since 04/11/21 periodically, she has felt dehydrated x2 days. Denies fever. Has not been around anyone sick. Reports these symptoms have occurred monthly since the birth of her last baby in 06/2020 and is relieved with GI cocktail. She has never seen a GI specialist.  S: Resting comfortably. no CTX, no VB.no LOF,  Active fetal movement. Denies: HA, visual changes, SOB, or RUQ.  Maternal Medical History:   Past Medical History:  Diagnosis Date   Pyelonephritis complicating pregnancy in third trimester 2014   Urinary tract infection 2914    Past Surgical History:  Procedure Laterality Date   NO PAST SURGERIES      Allergies  Allergen Reactions   Pineapple Anaphylaxis    Prior to Admission medications   Medication Sig Start Date End Date Taking? Authorizing Provider  acetaminophen (TYLENOL) 325 MG tablet Take 2 tablets (650 mg total) by mouth every 4 (four) hours as needed (for pain scale < 4  OR  temperature  >/=  100.5 F). 04/21/21   Gertie Fey, CNM  calcium carbonate (TUMS - DOSED IN MG ELEMENTAL CALCIUM) 500 MG chewable tablet Chew 2 tablets (400 mg of elemental calcium total) by mouth every 4 (four) hours as needed for indigestion. 04/21/21   Gertie Fey, CNM  lidocaine (XYLOCAINE) 2 %  solution Use as directed 15 mLs in the mouth or throat every 6 (six) hours as needed for mouth pain. 04/21/21   Gertie Fey, CNM  Prenatal Vit-Fe Fumarate-FA (PRENATAL MULTIVITAMIN) TABS tablet Take 1 tablet by mouth daily at 12 noon. 04/21/21   Gertie Fey, CNM  simethicone (MYLICON) 80 MG chewable tablet Chew 1 tablet (80 mg total) by mouth every 6 (six) hours as needed for up to 30 doses for flatulence. 04/21/21   Gertie Fey, CNM    Social History: She  reports that she quit smoking about 8 years ago. Her smoking use included cigarettes. She has never used smokeless tobacco. She reports that she does not drink alcohol and does not use drugs.  Family History: family history includes Diabetes in her father; Heart disease in her maternal grandfather and mother; Hypertension in her mother.  no history of gyn cancers  Review of Systems: A full review of systems was performed and negative except as noted in the HPI.    O:  BP 135/74 (BP Location: Right Arm)    Pulse 89    Temp 98.1 F (36.7 C) (Oral)    Resp 16    Ht 5\' 3"  (1.6 m)    Wt 98 kg    LMP 11/12/2020    BMI 38.26 kg/m  Results for orders placed or performed during the hospital encounter of 04/21/21 (from the past 48 hour(s))  Comprehensive metabolic panel   Collection Time: 04/21/21  8:25 AM  Result Value Ref  Range   Sodium 135 135 - 145 mmol/L   Potassium 3.9 3.5 - 5.1 mmol/L   Chloride 107 98 - 111 mmol/L   CO2 20 (L) 22 - 32 mmol/L   Glucose, Bld 101 (H) 70 - 99 mg/dL   BUN 6 6 - 20 mg/dL   Creatinine, Ser 0.50 0.44 - 1.00 mg/dL   Calcium 8.8 (L) 8.9 - 10.3 mg/dL   Total Protein 7.1 6.5 - 8.1 g/dL   Albumin 3.0 (L) 3.5 - 5.0 g/dL   AST 15 15 - 41 U/L   ALT 14 0 - 44 U/L   Alkaline Phosphatase 84 38 - 126 U/L   Total Bilirubin 0.3 0.3 - 1.2 mg/dL   GFR, Estimated >60 >60 mL/min   Anion gap 8 5 - 15  CBC   Collection Time: 04/21/21  8:25 AM  Result Value Ref Range   WBC 11.6 (H) 4.0 - 10.5  K/uL   RBC 4.39 3.87 - 5.11 MIL/uL   Hemoglobin 10.7 (L) 12.0 - 15.0 g/dL   HCT 33.6 (L) 36.0 - 46.0 %   MCV 76.5 (L) 80.0 - 100.0 fL   MCH 24.4 (L) 26.0 - 34.0 pg   MCHC 31.8 30.0 - 36.0 g/dL   RDW 14.8 11.5 - 15.5 %   Platelets 387 150 - 400 K/uL   nRBC 0.0 0.0 - 0.2 %      Constitutional: NAD, AAOx3  HE/ENT: extraocular movements grossly intact, moist mucous membranes CV: RRR PULM: nl respiratory effort, CTABL     Abd: gravid, non-tender, non-distended, soft, fundal height appropriate for gestational age      Ext: Non-tender, Nonedematous   Psych: mood appropriate, speech normal Pelvic: deferred  Fetal  monitoring: Handheld doppler with FHT 150s FH appropriate for gestational age   A/P: 26 y.o. [redacted]w[redacted]d here for antenatal surveillance for nausea, vomiting, and diarrhea  Principle Diagnosis:  26 year old [id number] with inadequate prenatal care at [redacted]w[redacted]d here for stomach pain and vomiting  1L LR and IV zofran for rehydration and nausea Simethicon and viscous lidocaine for abdominal pain relieved symptoms. Advised she go to the ER for evaluation of stomach pain since not OB related. She declines, states she feels better. Advised if symptoms return or worsen, go to ER for evaluation Provided information for BRAT diet to follow for 7 days Orally hydrate, if unable to stay hydrated orally, go to the ER Labor: not present.  Fetal Wellbeing: Reassuring FHT GI referral placed Establish care with OB provider, call our office and follow-up within 1wk D/c home stable, precautions reviewed, follow-up as scheduled.    Gertie Fey, CNM 04/21/2021 10:11 AM

## 2021-04-21 NOTE — OB Triage Note (Signed)
Patient Discharged home per provider. Pt educated about diet for GI upset and instructed to follow up with GI MD for further evaluation. Pt instructed to keep all follow up appointments with her provider. AVS given to patient and RN answered all questions and patient has no further questions at this time. Pt discharged home in stable condition.

## 2021-04-21 NOTE — Progress Notes (Signed)
Pt states she feels a little better after IV Zofran and IV fluids. She states the stomach pain still comes and goes but she is more worried about the pain returning after she eats. CNM aware and awaiting new orders.

## 2021-04-21 NOTE — OB Triage Note (Signed)
Pt is a 26yo [id number] at [redacted]w[redacted]d that presents for N/V Diarrhea and severe stomach pain. Pt has not had any prenatal care here this pregnancy as she just moved to town. She has an upcoming appointment in [place] for her initial Prenatal appointment but they were unable to see her for this concern because she has not officially established care. Pt states she has had N/V on and off since 04/11/2021 and has taken home covid test that were negative but the N/V has gotten worse since last night. Pt states she has had 7 episodes of Diarrhea and 2 episodes of emesis in the last 24 hours. Pt states she was told in April 2022 that she has Diverticulosis but was never treated. Pt states the pain is right above her umbilicus and describes it as burning, aching and the pain gets 6/10 pain scale when she eats. FHT obtained was 155 and no contractions noted. CNM notified of pt arrival to unit.

## 2021-04-23 ENCOUNTER — Telehealth: Payer: Self-pay

## 2021-04-23 NOTE — Telephone Encounter (Signed)
CALLED PATIENT NO ANSWER LEFT VOICEMAIL FOR A CALL BACK ? ?

## 2021-05-15 ENCOUNTER — Ambulatory Visit (INDEPENDENT_AMBULATORY_CARE_PROVIDER_SITE_OTHER): Payer: Medicaid Other | Admitting: Nurse Practitioner

## 2021-05-15 ENCOUNTER — Other Ambulatory Visit: Payer: Self-pay

## 2021-05-15 VITALS — BP 135/77 | HR 94 | Wt 224.2 lb

## 2021-05-15 DIAGNOSIS — Z8759 Personal history of other complications of pregnancy, childbirth and the puerperium: Secondary | ICD-10-CM

## 2021-05-15 DIAGNOSIS — O0932 Supervision of pregnancy with insufficient antenatal care, second trimester: Secondary | ICD-10-CM | POA: Diagnosis not present

## 2021-05-15 DIAGNOSIS — R8271 Bacteriuria: Secondary | ICD-10-CM

## 2021-05-15 DIAGNOSIS — Z3480 Encounter for supervision of other normal pregnancy, unspecified trimester: Secondary | ICD-10-CM | POA: Insufficient documentation

## 2021-05-15 DIAGNOSIS — O26892 Other specified pregnancy related conditions, second trimester: Secondary | ICD-10-CM

## 2021-05-15 DIAGNOSIS — Z6791 Unspecified blood type, Rh negative: Secondary | ICD-10-CM | POA: Diagnosis not present

## 2021-05-15 DIAGNOSIS — Z3A26 26 weeks gestation of pregnancy: Secondary | ICD-10-CM

## 2021-05-15 DIAGNOSIS — O26899 Other specified pregnancy related conditions, unspecified trimester: Secondary | ICD-10-CM | POA: Insufficient documentation

## 2021-05-15 DIAGNOSIS — O99210 Obesity complicating pregnancy, unspecified trimester: Secondary | ICD-10-CM

## 2021-05-15 DIAGNOSIS — O99212 Obesity complicating pregnancy, second trimester: Secondary | ICD-10-CM

## 2021-05-15 NOTE — Progress Notes (Signed)
Subjective:   Gabriella Peters is a 26 y.o. [phone] at 59w2dby LMP being seen for continuing prenatal care.  [facility] in media. Her obstetrical history is significant for  previous gestational hypertension with inductions at 37 weeks x 2 and history of shoulder dystocia, Rh negative blood type . Patient does intend to breast feed. Pregnancy history fully reviewed.  Patient reports no complaints.  HISTORY: OB History  Gravida Para Term Preterm AB Living  _0 SAB IAB Ectopic Multiple Live Births  3 0 0 0 3    # Outcome Date GA Lbr Len/2nd Weight Sex Delivery Anes PTL Lv  7 Current           6 Term 06/21/20     Vag-Spont   LIV  5 Term 12/28/18 [id number]:37 / 00:18 7 lb 5.5 oz (3.33 kg) F Vag-Spont EPI  LIV     Name: Bunning,GIRL Anastaisa     Apgar1: 6  Apgar5: 9  4 Preterm 03/17/13 [id number]:03 / 00:04 6 lb 5.9 oz (2.889 kg) M Vag-Spont EPI  LIV     Name: Bialecki,BOY Banita     Apgar1: 5  Apgar5: 7  3 SAB      SAB     2 SAB           1 SAB            Past Medical History:  Diagnosis Date   Pyelonephritis complicating pregnancy in third trimester 2014   Urinary tract infection 2914   Past Surgical History:  Procedure Laterality Date   NO PAST SURGERIES     Family History  Problem Relation Age of Onset   Hypertension Mother    Heart disease Mother    Diabetes Father    Heart disease Maternal Grandfather    Social History   Tobacco Use   Smoking status: Former    Types: Cigarettes    Quit date: 08/05/2012    Years since quitting: 8.7   Smokeless tobacco: Never   Tobacco comments:    May 2014 when found out preg  Substance Use Topics   Alcohol use: No   Drug use: No   Allergies  Allergen Reactions   Pineapple Anaphylaxis   Current Outpatient Medications on File Prior to Visit  Medication Sig Dispense Refill   acetaminophen (TYLENOL) 325 MG tablet Take 2 tablets (650 mg total) by mouth  every 4 (four) hours as needed (for pain scale < 4  OR  temperature  >/=  100.5 F).     calcium carbonate (TUMS - DOSED IN MG ELEMENTAL CALCIUM) 500 MG chewable tablet Chew 2 tablets (400 mg of elemental calcium total) by mouth every 4 (four) hours as needed for indigestion.     lidocaine (XYLOCAINE) 2 % solution Use as directed 15 mLs in the mouth or throat every 6 (six) hours as needed for mouth pain. 100 mL 0   Prenatal Vit-Fe Fumarate-FA (PRENATAL MULTIVITAMIN) TABS tablet Take 1 tablet by mouth daily at 12 noon.     simethicone (MYLICON) 80 MG chewable tablet Chew 1 tablet (80 mg total) by mouth every 6 (six) hours as needed for up to 30 doses for flatulence. 30 tablet 0   No current facility-administered medications on file prior to visit.     Exam   Vitals:   05/15/21 1109  BP: 135/77  Pulse: 94  Weight: 224 lb 3.2 oz (101.7 kg)   Fetal Heart Rate (bpm): 147  Uterus:     Pelvic Exam: Perineum: deferred   Vulva:    Vagina:     Cervix:    Adnexa:    Bony Pelvis: average  System: General: well-developed, well-nourished female in no acute distress   Breast:  deferred   Skin: normal coloration and turgor, no rashes   Neurologic: oriented, normal, negative, normal mood   Extremities: normal strength, tone, and muscle mass, ROM of all joints is normal   HEENT extraocular movement intact and sclera clear, anicteric   Mouth/Teeth deferred   Neck supple and no masses, normal thyroid   Cardiovascular: regular rate and rhythm   Respiratory:  no respiratory distress, normal breath sounds   Abdomen: soft, non-tender; no masses,  no organomegaly     Assessment:   Pregnancy: [id number] Patient Active Problem List   Diagnosis Date Noted   Supervision of other normal pregnancy, antepartum 05/15/2021   History of shoulder dystocia in prior pregnancy 05/15/2021   Rh negative status during pregnancy 05/15/2021   Nausea and vomiting 04/21/2021   Short interval between pregnancies  affecting pregnancy in second trimester, antepartum 02/15/2021   History of gestational hypertension 12/28/2018     Plan:  1. Supervision of other normal pregnancy, antepartum No headaches, no edema today although has had periodically in pregnancy  2. History of shoulder dystocia in prior pregnancy   3. Rh negative, antepartum To get Rhogam at next visit  4. Late prenatal care affecting pregnancy in second trimester  - [place] MFM OB DETAIL +14 WK; Future - Culture, OB Urine  5. History of gestational hypertension Will get baseline labs today, BP normal today but high normal Taking low dose aspirin Given BP cuff and advised to get Babyscripts and enter BP there  - CBC - Comp Met (CMET) - Protein / creatinine ratio, urine  6. [redacted] weeks gestation of pregnancy    Initial labs drawn. Continue prenatal vitamins. Genetic Screening discussed, NIPS:  completed at previous OB - low risk . Ultrasound discussed; fetal anatomic survey: ordered. Problem list reviewed and updated. The nature of [facility] with multiple MDs and other Advanced Practice Providers was explained to patient; also emphasized that residents, students are part of our team. Routine obstetric precautions reviewed. Return in about 2 weeks (around 05/29/2021) for early AM appt for fasting glucola - Needs MD appt to discuss BTL surgery.  Total face-to-face time with patient: 40 minutes.  Over 50% of encounter was spent on counseling and coordination of care.     Earlie Server, FNP Family Nurse Practitioner, [facility] for [employer], [facility] 05/15/2021 11:48 AM

## 2021-05-15 NOTE — Progress Notes (Signed)
Pap Smear 2023 Normal PHQ9: 0 GAD7: 0  Pt declines Tdap today, but would like it at next visit.

## 2021-05-16 LAB — CBC
Hematocrit: 33.9 % — ABNORMAL LOW (ref 34.0–46.6)
Hemoglobin: 10.5 g/dL — ABNORMAL LOW (ref 11.1–15.9)
MCH: 23.3 pg — ABNORMAL LOW (ref 26.6–33.0)
MCHC: 31 g/dL — ABNORMAL LOW (ref 31.5–35.7)
MCV: 75 fL — ABNORMAL LOW (ref 79–97)
Platelets: 429 10*3/uL (ref 150–450)
RBC: 4.5 x10E6/uL (ref 3.77–5.28)
RDW: 14.3 % (ref 11.7–15.4)
WBC: 10.5 10*3/uL (ref 3.4–10.8)

## 2021-05-16 LAB — COMPREHENSIVE METABOLIC PANEL
ALT: 8 IU/L (ref 0–32)
AST: 7 IU/L (ref 0–40)
Albumin/Globulin Ratio: 1.3 (ref 1.2–2.2)
Albumin: 3.8 g/dL — ABNORMAL LOW (ref 3.9–5.0)
Alkaline Phosphatase: 135 IU/L — ABNORMAL HIGH (ref 44–121)
BUN/Creatinine Ratio: 11 (ref 9–23)
BUN: 6 mg/dL (ref 6–20)
Bilirubin Total: 0.2 mg/dL (ref 0.0–1.2)
CO2: 19 mmol/L — ABNORMAL LOW (ref 20–29)
Calcium: 9.3 mg/dL (ref 8.7–10.2)
Chloride: 102 mmol/L (ref 96–106)
Creatinine, Ser: 0.53 mg/dL — ABNORMAL LOW (ref 0.57–1.00)
Globulin, Total: 2.9 g/dL (ref 1.5–4.5)
Glucose: 82 mg/dL (ref 70–99)
Potassium: 4.1 mmol/L (ref 3.5–5.2)
Sodium: 136 mmol/L (ref 134–144)
Total Protein: 6.7 g/dL (ref 6.0–8.5)
eGFR: 133 mL/min/{1.73_m2} (ref >59)

## 2021-05-16 LAB — PROTEIN / CREATININE RATIO, URINE
Creatinine, Urine: 128.7 mg/dL
Protein, Ur: 11.9 mg/dL
Protein/Creat Ratio: 92 mg/g creat (ref 0–200)

## 2021-05-17 ENCOUNTER — Encounter: Payer: Self-pay | Admitting: Nurse Practitioner

## 2021-05-17 DIAGNOSIS — O99210 Obesity complicating pregnancy, unspecified trimester: Secondary | ICD-10-CM | POA: Insufficient documentation

## 2021-05-19 LAB — URINE CULTURE, OB REFLEX

## 2021-05-19 LAB — CULTURE, OB URINE

## 2021-05-20 ENCOUNTER — Encounter: Payer: Self-pay | Admitting: Nurse Practitioner

## 2021-05-20 DIAGNOSIS — R8271 Bacteriuria: Secondary | ICD-10-CM | POA: Insufficient documentation

## 2021-05-20 MED ORDER — AMOXICILLIN 500 MG PO TABS: 500.0000 mg | ORAL_TABLET | Freq: Three times a day (TID) | ORAL | 0 refills | Status: AC

## 2021-05-20 NOTE — Addendum Note (Signed)
Addended by: Currie Paris on: 05/20/2021 09:18 PM   Modules accepted: Orders

## 2021-05-22 ENCOUNTER — Telehealth: Payer: Self-pay

## 2021-05-22 ENCOUNTER — Observation Stay
Admission: EM | Admit: 2021-05-22 | Discharge: 2021-05-22 | Disposition: A | Discharge status: Home / Self Care | Payer: Medicaid Other | Attending: Obstetrics and Gynecology | Admitting: Obstetrics and Gynecology

## 2021-05-22 ENCOUNTER — Other Ambulatory Visit: Payer: Self-pay

## 2021-05-22 ENCOUNTER — Encounter: Payer: Self-pay | Admitting: Obstetrics and Gynecology

## 2021-05-22 DIAGNOSIS — Z87891 Personal history of nicotine dependence: Secondary | ICD-10-CM | POA: Diagnosis not present

## 2021-05-22 DIAGNOSIS — O09892 Supervision of other high risk pregnancies, second trimester: Secondary | ICD-10-CM | POA: Diagnosis not present

## 2021-05-22 DIAGNOSIS — D509 Iron deficiency anemia, unspecified: Secondary | ICD-10-CM

## 2021-05-22 DIAGNOSIS — Z8759 Personal history of other complications of pregnancy, childbirth and the puerperium: Secondary | ICD-10-CM | POA: Insufficient documentation

## 2021-05-22 DIAGNOSIS — O99210 Obesity complicating pregnancy, unspecified trimester: Secondary | ICD-10-CM

## 2021-05-22 DIAGNOSIS — Z7982 Long term (current) use of aspirin: Secondary | ICD-10-CM | POA: Insufficient documentation

## 2021-05-22 DIAGNOSIS — O162 Unspecified maternal hypertension, second trimester: Principal | ICD-10-CM | POA: Insufficient documentation

## 2021-05-22 DIAGNOSIS — O99013 Anemia complicating pregnancy, third trimester: Secondary | ICD-10-CM

## 2021-05-22 DIAGNOSIS — R030 Elevated blood-pressure reading, without diagnosis of hypertension: Secondary | ICD-10-CM | POA: Insufficient documentation

## 2021-05-22 DIAGNOSIS — Z3A27 27 weeks gestation of pregnancy: Secondary | ICD-10-CM | POA: Diagnosis not present

## 2021-05-22 DIAGNOSIS — Z8632 Personal history of gestational diabetes: Secondary | ICD-10-CM

## 2021-05-22 LAB — CBC WITH DIFFERENTIAL/PLATELET
Abs Immature Granulocytes: 0.06 10*3/uL (ref 0.00–0.07)
Basophils Absolute: 0 10*3/uL (ref 0.0–0.1)
Basophils Relative: 0 %
Eosinophils Absolute: 0.1 10*3/uL (ref 0.0–0.5)
Eosinophils Relative: 1 %
HCT: 31.4 % — ABNORMAL LOW (ref 36.0–46.0)
Hemoglobin: 9.6 g/dL — ABNORMAL LOW (ref 12.0–15.0)
Immature Granulocytes: 1 %
Lymphocytes Relative: 28 %
Lymphs Abs: 2.9 10*3/uL (ref 0.7–4.0)
MCH: 23.2 pg — ABNORMAL LOW (ref 26.0–34.0)
MCHC: 30.6 g/dL (ref 30.0–36.0)
MCV: 76 fL — ABNORMAL LOW (ref 80.0–100.0)
Monocytes Absolute: 1.1 10*3/uL — ABNORMAL HIGH (ref 0.1–1.0)
Monocytes Relative: 11 %
Neutro Abs: 6 10*3/uL (ref 1.7–7.7)
Neutrophils Relative %: 59 %
Platelets: 409 10*3/uL — ABNORMAL HIGH (ref 150–400)
RBC: 4.13 MIL/uL (ref 3.87–5.11)
RDW: 14.9 % (ref 11.5–15.5)
WBC: 10.2 10*3/uL (ref 4.0–10.5)
nRBC: 0 % (ref 0.0–0.2)

## 2021-05-22 LAB — COMPREHENSIVE METABOLIC PANEL
ALT: 10 U/L (ref 0–44)
AST: 13 U/L — ABNORMAL LOW (ref 15–41)
Albumin: 2.7 g/dL — ABNORMAL LOW (ref 3.5–5.0)
Alkaline Phosphatase: 105 U/L (ref 38–126)
Anion gap: 5 (ref 5–15)
BUN: 7 mg/dL (ref 6–20)
CO2: 23 mmol/L (ref 22–32)
Calcium: 8.4 mg/dL — ABNORMAL LOW (ref 8.9–10.3)
Chloride: 107 mmol/L (ref 98–111)
Creatinine, Ser: 0.51 mg/dL (ref 0.44–1.00)
GFR, Estimated: >60 mL/min (ref >60)
Glucose, Bld: 99 mg/dL (ref 70–99)
Potassium: 3.7 mmol/L (ref 3.5–5.1)
Sodium: 135 mmol/L (ref 135–145)
Total Bilirubin: 0.3 mg/dL (ref 0.3–1.2)
Total Protein: 6.7 g/dL (ref 6.5–8.1)

## 2021-05-22 LAB — PROTEIN / CREATININE RATIO, URINE
Creatinine, Urine: 44 mg/dL
Total Protein, Urine: <6 mg/dL

## 2021-05-22 MED ORDER — ACETAMINOPHEN 500 MG PO TABS: 1000.0000 mg | ORAL_TABLET | Freq: Four times a day (QID) | ORAL | Status: DC | PRN

## 2021-05-22 MED ORDER — CALCIUM CARBONATE ANTACID 500 MG PO CHEW: 2.0000 | CHEWABLE_TABLET | ORAL | Status: DC | PRN

## 2021-05-22 MED ORDER — FERROUS SULFATE 325 (65 FE) MG PO TABS: 325.0000 mg | ORAL_TABLET | Freq: Every day | ORAL | 6 refills | Status: AC

## 2021-05-22 NOTE — Telephone Encounter (Signed)
S/w pt and she said that ealier today she had a BP of 151/78 and a headache. Pt states that she took some Tylenol and HA is better, BP now 130/64. Advised to follow up if anything changes.

## 2021-05-22 NOTE — Discharge Summary (Signed)
Gabriella Peters is a 26 y.o. female. She is at [redacted]w[redacted]d gestation. Patient's last menstrual period was 11/12/2020. Estimated Date of Delivery: 08/19/21  Prenatal care site:  [facility]   Chief complaint: elevated blood pressures at home   HPI: Gabriella Peters presents to L&D with complaints of elevated blood pressures at home.  She reports a history of gestational hypertension in previous pregnancy and states she had several mild range blood pressures this pregnancy.  Her prenatal provider instructed her to check her b/p's at home.  Gabriella Peters states that she had several mild range blood pressures at home today of 140-150/90 at home.  She has been packing for a move and has had a mild HA all day that was responsive to Tylenol.  Denies contractions, LOF, or vaginal bleeding. Endorses good fetal movement.  Denies visual changes or RUQ pain.    Factors complicating pregnancy: History of gestational hypertension  History of a shoulder dystocia in previous pregnancy  Short interval pregnancy  Obesity in pregnancy  Rh negative  GBS bacteruria  History of GDM   S: no CTX, no VB.no LOF,  Active fetal movement.   Maternal Medical History:  Past Medical Hx:  has a past medical history of Pyelonephritis complicating pregnancy in third trimester (2014) and Urinary tract infection (2914).    Past Surgical Hx:  has a past surgical history that includes No past surgeries.   Allergies  Allergen Reactions   Pineapple Anaphylaxis     Prior to Admission medications   Medication Sig Start Date End Date Taking? Authorizing Provider  acetaminophen (TYLENOL) 325 MG tablet Take 2 tablets (650 mg total) by mouth every 4 (four) hours as needed (for pain scale < 4  OR  temperature  >/=  100.5 F). 04/21/21  Yes Wilson, Danielle Renee, CNM  amoxicillin (AMOXIL) 500 MG tablet Take 1 tablet (500 mg total) by mouth 3 (three) times daily for 7 days. 05/20/21 05/27/21 Yes Burleson, Rona Ravens, NP  calcium  carbonate (TUMS - DOSED IN MG ELEMENTAL CALCIUM) 500 MG chewable tablet Chew 2 tablets (400 mg of elemental calcium total) by mouth every 4 (four) hours as needed for indigestion. 04/21/21  Yes Gertie Fey, CNM  aspirin 81 MG EC tablet Take 1 tablet by mouth daily. Patient not taking: Reported on 05/22/2021 01/17/21   [provider]  lidocaine (XYLOCAINE) 2 % solution Use as directed 15 mLs in the mouth or throat every 6 (six) hours as needed for mouth pain. Patient not taking: Reported on 05/22/2021 04/21/21   Gertie Fey, CNM  Prenatal Vit-Fe Fumarate-FA (PRENATAL MULTIVITAMIN) TABS tablet Take 1 tablet by mouth daily at 12 noon. Patient not taking: Reported on 05/22/2021 04/21/21   Gertie Fey, CNM  simethicone (MYLICON) 80 MG chewable tablet Chew 1 tablet (80 mg total) by mouth every 6 (six) hours as needed for up to 30 doses for flatulence. Patient not taking: Reported on 05/22/2021 04/21/21   Gertie Fey, CNM    Social History: She  reports that she quit smoking about 8 years ago. Her smoking use included cigarettes. She has never used smokeless tobacco. She reports that she does not drink alcohol and does not use drugs.  Family History: family history includes Cervical cancer in her mother; Diabetes in her father; Heart disease in her maternal grandfather and mother; Hypertension in her father and mother.   Review of Systems: A full review of systems was performed and negative except as noted  in the HPI.    O:  BP 116/74    Pulse 96    Temp 99.3 F (37.4 C) (Oral)    Resp 19    Ht 5\' 3"  (1.6 m)    Wt 101.6 kg    LMP 11/12/2020    BMI 39.68 kg/m  Vitals:   05/22/21 2217 05/22/21 2252 05/22/21 2300 05/22/21 2315  BP: 119/64 123/74 126/70 118/73   05/22/21 2330  BP: 116/74    Results for orders placed or performed during the hospital encounter of 05/22/21 (from the past 48 hour(s))  Protein / creatinine ratio, urine   Collection Time:  05/22/21 10:20 PM  Result Value Ref Range   Creatinine, Urine 44 mg/dL   Total Protein, Urine <6 mg/dL   Protein Creatinine Ratio        0.00 - 0.15 mg/mg[Cre]  Comprehensive metabolic panel   Collection Time: 05/22/21 11:04 PM  Result Value Ref Range   Sodium 135 135 - 145 mmol/L   Potassium 3.7 3.5 - 5.1 mmol/L   Chloride 107 98 - 111 mmol/L   CO2 23 22 - 32 mmol/L   Glucose, Bld 99 70 - 99 mg/dL   BUN 7 6 - 20 mg/dL   Creatinine, Ser 0.51 0.44 - 1.00 mg/dL   Calcium 8.4 (L) 8.9 - 10.3 mg/dL   Total Protein 6.7 6.5 - 8.1 g/dL   Albumin 2.7 (L) 3.5 - 5.0 g/dL   AST 13 (L) 15 - 41 U/L   ALT 10 0 - 44 U/L   Alkaline Phosphatase 105 38 - 126 U/L   Total Bilirubin 0.3 0.3 - 1.2 mg/dL   GFR, Estimated >60 >60 mL/min   Anion gap 5 5 - 15  CBC with Differential/Platelet   Collection Time: 05/22/21 11:04 PM  Result Value Ref Range   WBC 10.2 4.0 - 10.5 K/uL   RBC 4.13 3.87 - 5.11 MIL/uL   Hemoglobin 9.6 (L) 12.0 - 15.0 g/dL   HCT 31.4 (L) 36.0 - 46.0 %   MCV 76.0 (L) 80.0 - 100.0 fL   MCH 23.2 (L) 26.0 - 34.0 pg   MCHC 30.6 30.0 - 36.0 g/dL   RDW 14.9 11.5 - 15.5 %   Platelets 409 (H) 150 - 400 K/uL   nRBC 0.0 0.0 - 0.2 %   Neutrophils Relative % 59 %   Neutro Abs 6.0 1.7 - 7.7 K/uL   Lymphocytes Relative 28 %   Lymphs Abs 2.9 0.7 - 4.0 K/uL   Monocytes Relative 11 %   Monocytes Absolute 1.1 (H) 0.1 - 1.0 K/uL   Eosinophils Relative 1 %   Eosinophils Absolute 0.1 0.0 - 0.5 K/uL   Basophils Relative 0 %   Basophils Absolute 0.0 0.0 - 0.1 K/uL   Immature Granulocytes 1 %   Abs Immature Granulocytes 0.06 0.00 - 0.07 K/uL     Constitutional: NAD, AAOx3  CV: RRR PULM: nl respiratory effort Abd: gravid Ext: Non-tender, Nonedmeatous Psych: mood appropriate, speech normal Pelvic : deferred  Fetal Monitor: Baseline: 150 bpm Variability: moderate Accels: Present Decels: none Toco: none  Category: I   Assessment: 26 y.o. [redacted]w[redacted]d here for antenatal surveillance during  pregnancy.  Principle diagnosis: History of gestational hypertension [Z87.59]     Plan: Fetal Wellbeing: Reassuring Cat 1 tracing. Reactive NST  Blood pressures all WNL Preeclampsia labs normal  Labs c/w anemia of pregnancy - recommend starting on iron supplements  D/c home stable, precautions reviewed, follow-up as scheduled.   -----  Drinda Butts, CNM Certified Nurse Midwife [facility]

## 2021-05-22 NOTE — OB Triage Note (Signed)
Pt arrived with c/o elevated BP (at home BP cuff) and HA x 2 days. HA severity reduced with PO tylenol. HA 3/10 currently. Denies changes to vision, RUQ pain, sudden onset edema. Negative clonus. Patellar reflexes 2+. Denies contractions, LOF or vaginal bleeding. Continue to asses.

## 2021-05-29 ENCOUNTER — Encounter: Payer: Medicaid Other | Admitting: Obstetrics and Gynecology

## 2021-05-30 ENCOUNTER — Ambulatory Visit: Payer: Medicaid Other | Attending: Nurse Practitioner

## 2021-05-30 ENCOUNTER — Ambulatory Visit: Payer: Medicaid Other | Admitting: *Deleted

## 2021-05-30 ENCOUNTER — Other Ambulatory Visit: Payer: Self-pay

## 2021-05-30 VITALS — BP 109/61 | HR 94

## 2021-05-30 DIAGNOSIS — D509 Iron deficiency anemia, unspecified: Secondary | ICD-10-CM | POA: Diagnosis present

## 2021-05-30 DIAGNOSIS — O99210 Obesity complicating pregnancy, unspecified trimester: Secondary | ICD-10-CM | POA: Insufficient documentation

## 2021-05-30 DIAGNOSIS — O99013 Anemia complicating pregnancy, third trimester: Secondary | ICD-10-CM | POA: Insufficient documentation

## 2021-05-30 DIAGNOSIS — Z8632 Personal history of gestational diabetes: Secondary | ICD-10-CM | POA: Diagnosis present

## 2021-05-30 DIAGNOSIS — O0932 Supervision of pregnancy with insufficient antenatal care, second trimester: Secondary | ICD-10-CM | POA: Diagnosis present

## 2021-05-31 ENCOUNTER — Other Ambulatory Visit: Payer: Self-pay | Admitting: *Deleted

## 2021-05-31 DIAGNOSIS — Z6837 Body mass index (BMI) 37.0-37.9, adult: Secondary | ICD-10-CM

## 2021-06-06 ENCOUNTER — Encounter: Payer: Medicaid Other | Admitting: Obstetrics and Gynecology

## 2021-06-06 ENCOUNTER — Other Ambulatory Visit: Payer: Medicaid Other

## 2021-06-06 ENCOUNTER — Ambulatory Visit: Payer: Medicaid Other | Admitting: Gastroenterology

## 2021-06-06 NOTE — Progress Notes (Unsigned)
Gastroenterology Consultation  Referring Provider:     Janyce Llanos Primary Care Physician:  Patient, No Pcp Per (Inactive) Primary Gastroenterologist:  Dr. Servando Snare     Reason for Consultation:     Nausea vomiting and diarrhea        HPI:   Kashish Yglesias is a 26 y.o. y/o female referred for consultation & management of nausea vomiting diarrhea by Dr. Patient, No Pcp Per (Inactive).  This patient comes in with a history of following up with [facility] clinic OB and was reporting that she was having some nausea with vomiting and diarrhea.  She had reported that the symptoms had been going on since approximately the beginning of January and when she was seen on January 15 at her OB clinic she was reporting that she felt dehydrated for 2 days.  At the time she denied any sick contacts and also reported that she had been having the symptoms intermittently since having her previous child back in March 2022.  The patient was then referred for GI evaluation.  She is presently due in May of this year. The patient was evaluated again in February on the 15th of the month for hypertension during pregnancy.  At that time the patient had some lab sent off.  The patient's hemoglobin trending recently has shown:  Component     Latest Ref Rng & Units 12/29/2018 04/21/2021 05/15/2021 05/22/2021             Hemoglobin     12.0 - 15.0 g/dL 9.4 (L) 31.4 (L) 97.0 (L) 9.6 (L)  HCT     36.0 - 46.0 % 29.2 (L) 33.6 (L) 33.9 (L) 31.4 (L)     Past Medical History:  Diagnosis Date   Anemia    Chronic kidney disease    Hypertension    Pregnancy induced hypertension    Pyelonephritis complicating pregnancy in third trimester 2014   Urinary tract infection 2914    Past Surgical History:  Procedure Laterality Date   NO PAST SURGERIES      Prior to Admission medications   Medication Sig Start Date End Date Taking? Authorizing Provider  acetaminophen (TYLENOL) 325 MG tablet Take 2 tablets (650 mg  total) by mouth every 4 (four) hours as needed (for pain scale < 4  OR  temperature  >/=  100.5 F). 04/21/21   Janyce Llanos, CNM  aspirin 81 MG EC tablet Take 1 tablet by mouth daily. 01/17/21   [provider]  calcium carbonate (TUMS - DOSED IN MG ELEMENTAL CALCIUM) 500 MG chewable tablet Chew 2 tablets (400 mg of elemental calcium total) by mouth every 4 (four) hours as needed for indigestion. 04/21/21   Janyce Llanos, CNM  ferrous sulfate 325 (65 FE) MG tablet Take 1 tablet (325 mg total) by mouth daily with breakfast. Patient not taking: Reported on 05/30/2021 05/22/21 05/22/22  Gustavo Lah, CNM  Prenatal Vit-Fe Fumarate-FA (PRENATAL MULTIVITAMIN) TABS tablet Take 1 tablet by mouth daily at 12 noon. 04/21/21   Janyce Llanos, CNM    Family History  Problem Relation Age of Onset   Hypertension Mother    Heart disease Mother    Cervical cancer Mother    Kidney disease Father    Hypertension Father    Diabetes Father    COPD Maternal Grandmother    Cervical cancer Maternal Grandmother    Heart disease Maternal Grandfather      Social History   Tobacco  Use   Smoking status: Former    Types: Cigarettes    Quit date: 08/05/2012    Years since quitting: 8.8   Smokeless tobacco: Never   Tobacco comments:    May 2014 when found out preg  Vaping Use   Vaping Use: Never used  Substance Use Topics   Alcohol use: No   Drug use: No    Allergies as of 06/06/2021 - Review Complete 05/30/2021  Allergen Reaction Noted   Pineapple Anaphylaxis 07/01/2016    Review of Systems:    All systems reviewed and negative except where noted in HPI.   Physical Exam:  LMP 11/12/2020  Patient's last menstrual period was 11/12/2020. General:   Alert,  Well-developed, well-nourished, pleasant and cooperative in NAD Head:  Normocephalic and atraumatic. Eyes:  Sclera clear, no icterus.   Conjunctiva pink. Ears:  Normal auditory acuity. Neck:  Supple; no masses or  thyromegaly. Lungs:  Respirations even and unlabored.  Clear throughout to auscultation.   No wheezes, crackles, or rhonchi. No acute distress. Heart:  Regular rate and rhythm; no murmurs, clicks, rubs, or gallops. Abdomen:  Normal bowel sounds.  No bruits.  Soft, non-tender and non-distended without masses, hepatosplenomegaly or hernias noted.  No guarding or rebound tenderness.  Negative Carnett sign.   Rectal:  Deferred.  Pulses:  Normal pulses noted. Extremities:  No clubbing or edema.  No cyanosis. Neurologic:  Alert and oriented x3;  grossly normal neurologically. Skin:  Intact without significant lesions or rashes.  No jaundice. Lymph Nodes:  No significant cervical adenopathy. Psych:  Alert and cooperative. Normal mood and affect.  Imaging Studies: US MFM OB DETAIL +14 WK  Result Date: 05/30/2021 ----------------------------------------------------------------------  OBSTETRICS REPORT                       (Signed Final 05/30/2021 03:29 pm) ---------------------------------------------------------------------- Patient Info  ID #:       [id number]                          D.O.B.:  02-27-1996 (25 yrs)  Name:       Volanda NapoleonVICTORIA RENEE [id number]           Visit Date: 05/30/2021 01:57 pm ---------------------------------------------------------------------- Performed By  Attending:        Lin Landsmanorenthian Booker      Ref. Address:     [address]                    [address]                                                             [address]                                                             [phone]  [place]                                                             [address]  Performed By:     Eden Lathe BS      [facility]                                 [facility]  Referred By:      [facility] ---------------------------------------------------------------------- Orders  #  Description                           Code        Ordered By  1  [place] MFM OB DETAIL +14 WK               76811.01    Nolene Bernheim ----------------------------------------------------------------------  #  Order #                     Accession #                Episode #  1  [id number]                   [id number]                 [id number] ---------------------------------------------------------------------- Indications  [redacted] weeks gestation of pregnancy                Z3A.28  Antenatal screening for malformations          Z36.3  Obesity complicating pregnancy, third          O99.213  trimester (37 BMI)  Poor obstetric history: Previous gestational   O09.299  HTN/GDM/shoulder dystocia  Low risk NIPS  Insufficient Prenatal Care                     O09.30 ---------------------------------------------------------------------- Fetal Evaluation  Num Of Fetuses:         1  Fetal Heart Rate(bpm):  166  Cardiac Activity:       Observed  Presentation:           Cephalic  Placenta:               Anterior  P. Cord  Insertion:      Visualized  Amniotic Fluid  AFI FV:      Within normal limits  AFI Sum(cm)     %Tile       Largest Pocket(cm)  22.4            92          8  RUQ(cm)       RLQ(cm)       LUQ(cm)        LLQ(cm)  2.6           4.3           8              7.5 ---------------------------------------------------------------------- Biometry  BPD:        69  mm     G. Age:  27w 5d         18  %    CI:        69.77   %    70 - 86                                                          FL/HC:      19.5   %    18.8 - 20.6  HC:      263.6  mm     G. Age:  28w 5d         26  %    HC/AC:      1.03        1.05 - 1.21  AC:      256.6  mm     G. Age:  29w 6d         83  %    FL/BPD:     74.6   %    71 - 87  FL:       51.5  mm     G. Age:  27w 4d         14  %    FL/AC:      20.1   %    20 - 24  HUM:      50.1   mm     G. Age:  29w 3d         64  %  CER:      33.4  mm     G. Age:  28w 3d         50  %  LV:        4.2  mm  CM:        6.7  mm  Est. FW:    1289  gm    2 lb 13 oz      52  % ---------------------------------------------------------------------- OB History  Gravidity:    7         Term:   2        Prem:   1        SAB:   3  TOP:          0       Ectopic:  0        Living: 3 ---------------------------------------------------------------------- Gestational Age  LMP:           28w 3d  Date:  11/12/20                 EDD:   08/19/21  U/S Today:     28w 3d                                        EDD:   08/19/21  Best:          28w 3d     Det. By:  LMP  (11/12/20)          EDD:   08/19/21 ---------------------------------------------------------------------- Anatomy  Cranium:               Appears normal         LVOT:                   Appears normal  Cavum:                 Appears normal         Aortic Arch:            Appears normal  Ventricles:            Appears normal         Ductal Arch:            Appears normal  Choroid Plexus:        Appears normal         Diaphragm:              Appears normal  Cerebellum:            Appears normal         Stomach:                Appears normal, left                                                                        sided  Posterior Fossa:       Appears normal         Abdomen:                Appears normal  Nuchal Fold:           Not applicable (>20    Abdominal Wall:         Appears nml (cord                         wks GA)                                        insert, abd wall)  Face:                  Appears normal         Cord Vessels:           Appears normal (3                         (orbits and profile)  vessel cord)  Lips:                  Appears normal         Kidneys:                Appear normal  Palate:                Appears normal         Bladder:                Appears normal  Thoracic:              Appears normal          Spine:                  Appears normal  Heart:                 Appears normal         Upper Extremities:      Appears normal                         (4CH, axis, and                         situs)  RVOT:                  Appears normal         Lower Extremities:      Appears normal  Other:  Heels/feet, 3VV, 3VT, nasal bone, lenses, mandible, and maxilla          visualized. Fetus appears to be female. ---------------------------------------------------------------------- Cervix Uterus Adnexa  Cervix  Length:           3.69  cm.  Normal appearance by transabdominal scan.  Uterus  No abnormality visualized.  Right Ovary  Within normal limits.  Left Ovary  Within normal limits.  Cul De Sac  No free fluid seen.  Adnexa  No abnormality visualized. ---------------------------------------------------------------------- Impression  Single intrauterine pregnancy here for a detailed anatomy  due elevated BMI  Normal anatomy with measurements consistent with dates  There is good fetal movement and amniotic fluid volume  Suboptimal views of the fetal anatomy were obtained  secondary to fetal position.  Today we were suspicious of an aberrant vessel coming off of  the aorta. This was not seen in all views. I explained that this  could be a subclavian artery. However, due to advanced  gestational age and fetal position I personally was not able to  characterize this finding. The fetal heart overall appeared  normal. I counseled Ms. Wieber that we would reassess this  structure at the next appointment and possibly recommend a  postnatal echocardiogram. ---------------------------------------------------------------------- Recommendations  Follow up growth in 4-6 weeks. ----------------------------------------------------------------------               Lin Landsman, MD Electronically Signed Final Report   05/30/2021 03:29 pm ----------------------------------------------------------------------   Assessment and Plan:    Shiara Mcgough is a 26 y.o. y/o female ***    Midge Minium, MD. Clementeen Graham    Note: This dictation was prepared with Dragon dictation along with smaller phrase technology. Any transcriptional errors that result from this process are unintentional.

## 2021-06-12 ENCOUNTER — Telehealth: Payer: Self-pay

## 2021-06-12 ENCOUNTER — Ambulatory Visit: Payer: Medicaid Other | Admitting: Gastroenterology

## 2021-06-12 NOTE — Telephone Encounter (Signed)
Do not make another appointment for patient with [place] [facility] she keeps no showing  ?

## 2021-06-12 NOTE — Progress Notes (Deleted)
Gastroenterology Consultation  Referring Provider:     Janyce Llanos Primary Care Physician:  Patient, No Pcp Per (Inactive) Primary Gastroenterologist:  Dr. Servando Snare     Reason for Consultation:     Nausea vomiting diarrhea and abdominal pain        HPI:   Gabriella Peters is a 26 y.o. y/o female referred for consultation & management of nausea vomiting diarrhea and abdominal pain by Dr. Patient, No Pcp Per (Inactive).  This patient comes today after not showing up for her last appointment a week ago.  The patient has been reported that she is pregnant and has been having elevated blood pressure.  Back in January the patient was seen by her nurse midwife and was reported to have stomach pains when eating followed by nausea vomiting and diarrhea.  At that time the patient was reporting that it had been going on periodically since early January and she had reported that time she was also feeling dehydrated.  Prior to that the patient reports that the symptoms have occurred monthly since the birth of her last child in March 2022.  She has had relief by a GI cocktail in the past.    Past Medical History:  Diagnosis Date   Anemia    Chronic kidney disease    Hypertension    Pregnancy induced hypertension    Pyelonephritis complicating pregnancy in third trimester 2014   Urinary tract infection 2914    Past Surgical History:  Procedure Laterality Date   NO PAST SURGERIES      Prior to Admission medications   Medication Sig Start Date End Date Taking? Authorizing Provider  acetaminophen (TYLENOL) 325 MG tablet Take 2 tablets (650 mg total) by mouth every 4 (four) hours as needed (for pain scale < 4  OR  temperature  >/=  100.5 F). 04/21/21   Janyce Llanos, CNM  aspirin 81 MG EC tablet Take 1 tablet by mouth daily. 01/17/21   [provider]  calcium carbonate (TUMS - DOSED IN MG ELEMENTAL CALCIUM) 500 MG chewable tablet Chew 2 tablets (400 mg of elemental  calcium total) by mouth every 4 (four) hours as needed for indigestion. 04/21/21   Janyce Llanos, CNM  ferrous sulfate 325 (65 FE) MG tablet Take 1 tablet (325 mg total) by mouth daily with breakfast. Patient not taking: Reported on 05/30/2021 05/22/21 05/22/22  Gustavo Lah, CNM  Prenatal Vit-Fe Fumarate-FA (PRENATAL MULTIVITAMIN) TABS tablet Take 1 tablet by mouth daily at 12 noon. 04/21/21   Janyce Llanos, CNM    Family History  Problem Relation Age of Onset   Hypertension Mother    Heart disease Mother    Cervical cancer Mother    Kidney disease Father    Hypertension Father    Diabetes Father    COPD Maternal Grandmother    Cervical cancer Maternal Grandmother    Heart disease Maternal Grandfather      Social History   Tobacco Use   Smoking status: Former    Types: Cigarettes    Quit date: 08/05/2012    Years since quitting: 8.8   Smokeless tobacco: Never   Tobacco comments:    May 2014 when found out preg  Vaping Use   Vaping Use: Never used  Substance Use Topics   Alcohol use: No   Drug use: No    Allergies as of 06/12/2021 - Review Complete 05/30/2021  Allergen Reaction Noted   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   Physical Exam:  LMP 11/12/2020  Patient's last menstrual period was 11/12/2020. General:   Alert,  Well-developed, well-nourished, pleasant and cooperative in NAD Head:  Normocephalic and atraumatic. Eyes:  Sclera clear, no icterus.   Conjunctiva pink. Ears:  Normal auditory acuity. Neck:  Supple; no masses or thyromegaly. Lungs:  Respirations even and unlabored.  Clear throughout to auscultation.   No wheezes, crackles, or rhonchi. No acute distress. Heart:  Regular rate and rhythm; no murmurs, clicks, rubs, or gallops. Abdomen:  Normal bowel sounds.  No bruits.  Soft, non-tender and non-distended without masses, hepatosplenomegaly or hernias noted.   No guarding or rebound tenderness.  Negative Carnett sign.   Rectal:  Deferred.  Pulses:  Normal pulses noted. Extremities:  No clubbing or edema.  No cyanosis. Neurologic:  Alert and oriented x3;  grossly normal neurologically. Skin:  Intact without significant lesions or rashes.  No jaundice. Lymph Nodes:  No significant cervical adenopathy. Psych:  Alert and cooperative. Normal mood and affect.  Imaging Studies: [place] MFM OB DETAIL +14 WK  Result Date: 05/30/2021 ----------------------------------------------------------------------  OBSTETRICS REPORT                       (Signed Final 05/30/2021 03:29 pm) ---------------------------------------------------------------------- Patient Info  ID #:       [id number]                          D.O.B.:  Jan 16, 1996 (25 yrs)  Name:       Gabriella Peters           Visit Date: 05/30/2021 01:57 pm ---------------------------------------------------------------------- Performed By  Attending:        Lin Landsman      Ref. Address:     [address]                    [place]                                                             [address]                                                             [address]                                                             [place]                                                             [address]  Performed By:  Eden Lathe BS      Location[facility]                    RD[facility] RVT                                 [facility]  Referred By:      [facility] ---------------------------------------------------------------------- Orders  #  Description                           Code        Ordered By  1  [place] MFM OB DETAIL +14 WK               76811.01    Nolene Bernheim ----------------------------------------------------------------------  #  Order #                      Accession #                Episode #  1  [id number]                   [id number]                 [id number] ---------------------------------------------------------------------- Indications  [redacted] weeks gestation of pregnancy                Z3A.28  Antenatal screening for malformations          Z36.3  Obesity complicating pregnancy, third          O99.213  trimester (37 BMI)  Poor obstetric history: Previous gestational   O09.299  HTN/GDM/shoulder dystocia  Low risk NIPS  Insufficient Prenatal Care                     O09.30 ---------------------------------------------------------------------- Fetal Evaluation  Num Of Fetuses:         1  Fetal Heart Rate(bpm):  166  Cardiac Activity:       Observed  Presentation:           Cephalic  Placenta:               Anterior  P. Cord Insertion:      Visualized  Amniotic Fluid  AFI FV:      Within normal limits  AFI Sum(cm)     %Tile       Largest Pocket(cm)  22.4            92          8  RUQ(cm)       RLQ(cm)  LUQ(cm)        LLQ(cm)  2.6           4.3           8              7.5 ---------------------------------------------------------------------- Biometry  BPD:        69  mm     G. Age:  27w 5d         18  %    CI:        69.77   %    70 - 86                                                          FL/HC:      19.5   %    18.8 - 20.6  HC:      263.6  mm     G. Age:  28w 5d         26  %    HC/AC:      1.03        1.05 - 1.21  AC:      256.6  mm     G. Age:  29w 6d         83  %    FL/BPD:     74.6   %    71 - 87  FL:       51.5  mm     G. Age:  27w 4d         14  %    FL/AC:      20.1   %    20 - 24  HUM:      50.1  mm     G. Age:  29w 3d         64  %  CER:      33.4  mm     G. Age:  28w 3d         50  %  LV:        4.2  mm  CM:        6.7  mm  Est. FW:    1289  gm    2 lb 13 oz      52  % ---------------------------------------------------------------------- OB History  Gravidity:    7         Term:   2        Prem:   1        SAB:   3  TOP:          0        Ectopic:  0        Living: 3 ---------------------------------------------------------------------- Gestational Age  LMP:           28w 3d        Date:  11/12/20                 EDD:   08/19/21  U/S Today:     28w 3d  EDD:   08/19/21  Best:          Eden Emms 3d     Det. By:  LMP  (11/12/20)          EDD:   08/19/21 ---------------------------------------------------------------------- Anatomy  Cranium:               Appears normal         LVOT:                   Appears normal  Cavum:                 Appears normal         Aortic Arch:            Appears normal  Ventricles:            Appears normal         Ductal Arch:            Appears normal  Choroid Plexus:        Appears normal         Diaphragm:              Appears normal  Cerebellum:            Appears normal         Stomach:                Appears normal, left                                                                        sided  Posterior Fossa:       Appears normal         Abdomen:                Appears normal  Nuchal Fold:           Not applicable (>20    Abdominal Wall:         Appears nml (cord                         wks GA)                                        insert, abd wall)  Face:                  Appears normal         Cord Vessels:           Appears normal (3                         (orbits and profile)                           vessel cord)  Lips:                  Appears normal         Kidneys:  Appear normal  Palate:                Appears normal         Bladder:                Appears normal  Thoracic:              Appears normal         Spine:                  Appears normal  Heart:                 Appears normal         Upper Extremities:      Appears normal                         (4CH, axis, and                         situs)  RVOT:                  Appears normal         Lower Extremities:      Appears normal  Other:  Heels/feet, 3VV, 3VT, nasal bone, lenses, mandible, and  maxilla          visualized. Fetus appears to be female. ---------------------------------------------------------------------- Cervix Uterus Adnexa  Cervix  Length:           3.69  cm.  Normal appearance by transabdominal scan.  Uterus  No abnormality visualized.  Right Ovary  Within normal limits.  Left Ovary  Within normal limits.  Cul De Sac  No free fluid seen.  Adnexa  No abnormality visualized. ---------------------------------------------------------------------- Impression  Single intrauterine pregnancy here for a detailed anatomy  due elevated BMI  Normal anatomy with measurements consistent with dates  There is good fetal movement and amniotic fluid volume  Suboptimal views of the fetal anatomy were obtained  secondary to fetal position.  Today we were suspicious of an aberrant vessel coming off of  the aorta. This was not seen in all views. I explained that this  could be a subclavian artery. However, due to advanced  gestational age and fetal position I personally was not able to  characterize this finding. The fetal heart overall appeared  normal. I counseled Ms. Mickiewicz that we would reassess this  structure at the next appointment and possibly recommend a  postnatal echocardiogram. ---------------------------------------------------------------------- Recommendations  Follow up growth in 4-6 weeks. ----------------------------------------------------------------------               Lin Landsman, MD Electronically Signed Final Report   05/30/2021 03:29 pm ----------------------------------------------------------------------   Assessment and Plan:   Gabriella Peters is a 26 y.o. y/o female ***    Midge Minium, MD. Clementeen Graham    Note: This dictation was prepared with Dragon dictation along with smaller phrase technology. Any transcriptional errors that result from this process are unintentional.

## 2021-07-05 ENCOUNTER — Ambulatory Visit: Payer: Medicaid Other | Admitting: *Deleted

## 2021-07-05 ENCOUNTER — Ambulatory Visit: Payer: Medicaid Other | Attending: Maternal & Fetal Medicine

## 2021-07-05 VITALS — BP 128/74 | HR 98

## 2021-07-05 DIAGNOSIS — O360190 Maternal care for anti-D [Rh] antibodies, unspecified trimester, not applicable or unspecified: Secondary | ICD-10-CM

## 2021-07-05 DIAGNOSIS — O0930 Supervision of pregnancy with insufficient antenatal care, unspecified trimester: Secondary | ICD-10-CM

## 2021-07-05 DIAGNOSIS — O99213 Obesity complicating pregnancy, third trimester: Secondary | ICD-10-CM

## 2021-07-05 DIAGNOSIS — D509 Iron deficiency anemia, unspecified: Secondary | ICD-10-CM | POA: Diagnosis present

## 2021-07-05 DIAGNOSIS — O99013 Anemia complicating pregnancy, third trimester: Secondary | ICD-10-CM | POA: Diagnosis present

## 2021-07-05 DIAGNOSIS — Z6837 Body mass index (BMI) 37.0-37.9, adult: Secondary | ICD-10-CM | POA: Diagnosis not present

## 2021-07-05 DIAGNOSIS — Z8632 Personal history of gestational diabetes: Secondary | ICD-10-CM | POA: Insufficient documentation

## 2021-07-05 DIAGNOSIS — Z362 Encounter for other antenatal screening follow-up: Secondary | ICD-10-CM

## 2021-07-05 DIAGNOSIS — O99210 Obesity complicating pregnancy, unspecified trimester: Secondary | ICD-10-CM | POA: Insufficient documentation

## 2021-07-05 DIAGNOSIS — O09293 Supervision of pregnancy with other poor reproductive or obstetric history, third trimester: Secondary | ICD-10-CM | POA: Diagnosis not present

## 2021-07-05 DIAGNOSIS — Z3A33 33 weeks gestation of pregnancy: Secondary | ICD-10-CM

## 2021-07-05 DIAGNOSIS — E669 Obesity, unspecified: Secondary | ICD-10-CM

## 2021-07-08 ENCOUNTER — Other Ambulatory Visit: Payer: Self-pay | Admitting: *Deleted

## 2021-07-08 DIAGNOSIS — O09293 Supervision of pregnancy with other poor reproductive or obstetric history, third trimester: Secondary | ICD-10-CM

## 2021-07-08 DIAGNOSIS — O99213 Obesity complicating pregnancy, third trimester: Secondary | ICD-10-CM

## 2021-07-08 DIAGNOSIS — Z8759 Personal history of other complications of pregnancy, childbirth and the puerperium: Secondary | ICD-10-CM

## 2021-07-08 NOTE — Progress Notes (Unsigned)
Us/

## 2021-08-02 ENCOUNTER — Ambulatory Visit: Payer: Medicaid Other | Attending: Obstetrics

## 2021-08-02 ENCOUNTER — Ambulatory Visit: Payer: Medicaid Other
